# Patient Record
Sex: Female | Born: 1975 | Race: White | Hispanic: No | Marital: Married | Smoking: Former smoker
Health system: Southern US, Community
[De-identification: ages and names within clinical notes are randomized; demographics above are authoritative.]

## PROBLEM LIST (undated history)

## (undated) DIAGNOSIS — T7840XA Allergy, unspecified, initial encounter: Secondary | ICD-10-CM

## (undated) DIAGNOSIS — F419 Anxiety disorder, unspecified: Secondary | ICD-10-CM

## (undated) DIAGNOSIS — F32A Depression, unspecified: Secondary | ICD-10-CM

## (undated) DIAGNOSIS — J45909 Unspecified asthma, uncomplicated: Secondary | ICD-10-CM

## (undated) DIAGNOSIS — D649 Anemia, unspecified: Secondary | ICD-10-CM

## (undated) DIAGNOSIS — F329 Major depressive disorder, single episode, unspecified: Secondary | ICD-10-CM

## (undated) DIAGNOSIS — R12 Heartburn: Secondary | ICD-10-CM

## (undated) DIAGNOSIS — R51 Headache: Secondary | ICD-10-CM

## (undated) DIAGNOSIS — N979 Female infertility, unspecified: Secondary | ICD-10-CM

## (undated) DIAGNOSIS — R519 Headache, unspecified: Secondary | ICD-10-CM

## (undated) DIAGNOSIS — M549 Dorsalgia, unspecified: Secondary | ICD-10-CM

## (undated) DIAGNOSIS — J40 Bronchitis, not specified as acute or chronic: Secondary | ICD-10-CM

## (undated) DIAGNOSIS — E039 Hypothyroidism, unspecified: Secondary | ICD-10-CM

## (undated) DIAGNOSIS — R0602 Shortness of breath: Secondary | ICD-10-CM

## (undated) DIAGNOSIS — K5900 Constipation, unspecified: Secondary | ICD-10-CM

## (undated) HISTORY — DX: Shortness of breath: R06.02

## (undated) HISTORY — DX: Allergy, unspecified, initial encounter: T78.40XA

## (undated) HISTORY — PX: DILATION AND CURETTAGE OF UTERUS: SHX78

## (undated) HISTORY — DX: Dorsalgia, unspecified: M54.9

## (undated) HISTORY — DX: Female infertility, unspecified: N97.9

## (undated) HISTORY — DX: Heartburn: R12

## (undated) HISTORY — DX: Anxiety disorder, unspecified: F41.9

## (undated) HISTORY — DX: Constipation, unspecified: K59.00

## (undated) HISTORY — DX: Unspecified asthma, uncomplicated: J45.909

---
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
## 2004-11-05 ENCOUNTER — Ambulatory Visit (HOSPITAL_COMMUNITY): Admission: RE | Admit: 2004-11-05 | Discharge: 2004-11-05 | Payer: Self-pay | Admitting: Obstetrics and Gynecology

## 2004-11-05 ENCOUNTER — Ambulatory Visit (HOSPITAL_BASED_OUTPATIENT_CLINIC_OR_DEPARTMENT_OTHER): Admission: RE | Admit: 2004-11-05 | Discharge: 2004-11-05 | Payer: Self-pay | Admitting: Obstetrics and Gynecology

## 2005-10-30 ENCOUNTER — Inpatient Hospital Stay (HOSPITAL_COMMUNITY): Admission: AD | Admit: 2005-10-30 | Discharge: 2005-10-30 | Payer: Self-pay | Admitting: Obstetrics and Gynecology

## 2005-11-05 ENCOUNTER — Ambulatory Visit (HOSPITAL_COMMUNITY): Admission: RE | Admit: 2005-11-05 | Discharge: 2005-11-05 | Payer: Self-pay | Admitting: Obstetrics and Gynecology

## 2005-11-05 ENCOUNTER — Encounter (INDEPENDENT_AMBULATORY_CARE_PROVIDER_SITE_OTHER): Payer: Self-pay | Admitting: Specialist

## 2006-07-16 ENCOUNTER — Inpatient Hospital Stay (HOSPITAL_COMMUNITY): Admission: AD | Admit: 2006-07-16 | Discharge: 2006-07-16 | Payer: Self-pay | Admitting: Obstetrics and Gynecology

## 2007-02-05 ENCOUNTER — Inpatient Hospital Stay (HOSPITAL_COMMUNITY): Admission: AD | Admit: 2007-02-05 | Discharge: 2007-02-05 | Payer: Self-pay | Admitting: Obstetrics and Gynecology

## 2007-08-30 ENCOUNTER — Other Ambulatory Visit: Admission: RE | Admit: 2007-08-30 | Discharge: 2007-08-30 | Payer: Self-pay | Admitting: Family Medicine

## 2008-03-20 ENCOUNTER — Encounter: Admission: RE | Admit: 2008-03-20 | Discharge: 2008-03-20 | Payer: Self-pay | Admitting: Family Medicine

## 2009-09-03 ENCOUNTER — Other Ambulatory Visit: Admission: RE | Admit: 2009-09-03 | Discharge: 2009-09-03 | Payer: Self-pay | Admitting: Family Medicine

## 2010-07-03 NOTE — Op Note (Signed)
NAMEDEVANI, ODONNEL            ACCOUNT NO.:  [id number]   MEDICAL RECORD NO.:  [id number]          PATIENT TYPE:  AMB   LOCATION:  [facility]                         FACILITY:  [facility]   PHYSICIAN:  Michelle L. Grewal, M.D.DATE OF BIRTH:  1975/11/25   DATE OF PROCEDURE:  11/05/2004  DATE OF DISCHARGE:                                 OPERATIVE REPORT   PREOPERATIVE DIAGNOSES:  Missed abortion.   POSTOPERATIVE DIAGNOSES:  Missed abortion.   PROCEDURE:  Dilatation and evacuation with chromosome studies.   SURGEON:  Dr. Vincente Poli   ANESTHESIA:  MAC with paracervical block.   SPECIMENS:  Products of conception.   ESTIMATED BLOOD LOSS:  Minimal.   COMPLICATIONS:  None.   PROCEDURE:  Patient was taken to the operating room.  She was given her  sedation and placed in the lithotomy position.  She had voided just before  she had entered the operating room.  She is prepped and draped in the usual  sterile fashion.  A speculum is placed in the vagina.  The cervix is grasped  with a tenaculum and a paracervical block is performed in a standard  fashion.  The uterus is gently sounded and is 8 cm and is in the midline  position.  The cervical internal os is gently dilated to a #25 Pratt  dilator.  A #7 suction cannula is inserted into the uterus and suction  curettage is performed with retrieval of contents grossly consistent with  products of conception.  Sharp curette is then inserted and the uterus is  thoroughly curetted of all tissue.  A final suction curettage is performed  which is clean.  Half of the tissue is sent for pathology, half is sent for  chromosome analysis.  There is no active bleeding at the end of the  procedure.  All instruments removed from the vagina.  All sponge, lap, and  instrument counts correct x2.      Michelle L. Vincente Poli, M.D.  Electronically Signed     MLG/MEDQ  D:  11/05/2004  T:  11/05/2004  Job:  [id number]

## 2010-07-03 NOTE — Op Note (Signed)
Crystal Camacho, Crystal Camacho            ACCOUNT NO.:  [id number]   MEDICAL RECORD NO.:  [id number]          PATIENT TYPE:  AMB   LOCATION:  [facility]                           FACILITY:  [facility]   PHYSICIAN:  Michelle L. Grewal, M.D.DATE OF BIRTH:  August 08, 1975   DATE OF PROCEDURE:  11/05/2005  DATE OF DISCHARGE:  11/05/2005                                 OPERATIVE REPORT   PREOPERATIVE DIAGNOSIS:  Missed abortion and recurrent pregnancy loss.   POSTOPERATIVE DIAGNOSIS:  Missed abortion and recurrent pregnancy loss.   PROCEDURE:  Dilatation and evacuation.   SURGEON:  Michelle L. Vincente Poli, M.D.   ANESTHESIA:  MAC with local.   SPECIMENS:  Products of conception.   ESTIMATED BLOOD LOSS:  Minimal.   DESCRIPTION OF PROCEDURE:  The patient was taken to the operating room.  She  was given anesthesia.  She was placed in the lithotomy position.  She was  prepped and draped in the usual sterile fashion.  In-and-out catheter was  used to empty the bladder.  A speculum was placed in the vagina and the  cervix was grasped with tenaculum and a paracervical block was performed in  the standard fashion.  There was noted to be some blood in the vagina  already.  The cervical internal os was gently dilated using Pratt dilators  and a #7 suction cannula was inserted into the uterus easily and retrieved  contents consistent with products of conception.  After the suction  curettage was performed, a sharp curet was inserted in the remainder of the  tissue was curetted out.  A final suction curettage was performed. A portion  of the tissue was sent to pathology for analysis and another portion was  sent for routine karyotype given the patient's history of recurrent  pregnancy loss.  At the end of the procedure, there was minimal bleeding  noted.  All instruments and sponges were removed from the vagina.  The  patient went to the recovery room in stable condition.      Michelle L. Vincente Poli, M.D.  Electronically  Signed     MLG/MEDQ  D:  11/05/2005  T:  11/07/2005  Job:  [id number]

## 2010-09-09 ENCOUNTER — Other Ambulatory Visit: Payer: Self-pay | Admitting: Family Medicine

## 2010-09-09 ENCOUNTER — Other Ambulatory Visit (HOSPITAL_COMMUNITY)
Admission: RE | Admit: 2010-09-09 | Discharge: 2010-09-09 | Disposition: A | Discharge status: Home / Self Care | Payer: BC Managed Care – PPO | Source: Ambulatory Visit | Attending: Family Medicine | Admitting: Family Medicine

## 2010-09-09 DIAGNOSIS — Z124 Encounter for screening for malignant neoplasm of cervix: Secondary | ICD-10-CM | POA: Insufficient documentation

## 2011-11-16 ENCOUNTER — Other Ambulatory Visit (HOSPITAL_COMMUNITY)
Admission: RE | Admit: 2011-11-16 | Discharge: 2011-11-16 | Disposition: A | Discharge status: Home / Self Care | Payer: BC Managed Care – PPO | Source: Ambulatory Visit | Attending: Family Medicine | Admitting: Family Medicine

## 2011-11-16 ENCOUNTER — Other Ambulatory Visit: Payer: Self-pay | Admitting: Family Medicine

## 2011-11-16 DIAGNOSIS — Z124 Encounter for screening for malignant neoplasm of cervix: Secondary | ICD-10-CM | POA: Insufficient documentation

## 2014-03-12 ENCOUNTER — Other Ambulatory Visit: Payer: Self-pay | Admitting: Family Medicine

## 2014-03-12 ENCOUNTER — Other Ambulatory Visit (HOSPITAL_COMMUNITY)
Admission: RE | Admit: 2014-03-12 | Discharge: 2014-03-12 | Disposition: A | Discharge status: Home / Self Care | Payer: BC Managed Care – PPO | Source: Ambulatory Visit | Attending: Family Medicine | Admitting: Family Medicine

## 2014-03-12 DIAGNOSIS — Z124 Encounter for screening for malignant neoplasm of cervix: Secondary | ICD-10-CM | POA: Insufficient documentation

## 2014-03-12 DIAGNOSIS — Z1151 Encounter for screening for human papillomavirus (HPV): Secondary | ICD-10-CM | POA: Diagnosis present

## 2014-03-15 LAB — CYTOLOGY - PAP

## 2015-11-04 ENCOUNTER — Ambulatory Visit
Admission: RE | Admit: 2015-11-04 | Discharge: 2015-11-04 | Disposition: A | Discharge status: Home / Self Care | Payer: BC Managed Care – PPO | Source: Ambulatory Visit | Attending: Family Medicine | Admitting: Family Medicine

## 2015-11-04 ENCOUNTER — Other Ambulatory Visit: Payer: Self-pay | Admitting: Family Medicine

## 2015-11-04 DIAGNOSIS — R609 Edema, unspecified: Secondary | ICD-10-CM

## 2015-11-04 DIAGNOSIS — M79671 Pain in right foot: Secondary | ICD-10-CM

## 2015-11-04 DIAGNOSIS — T148XXA Other injury of unspecified body region, initial encounter: Secondary | ICD-10-CM

## 2016-01-23 ENCOUNTER — Ambulatory Visit
Admission: RE | Admit: 2016-01-23 | Discharge: 2016-01-23 | Disposition: A | Discharge status: Home / Self Care | Payer: BC Managed Care – PPO | Source: Ambulatory Visit | Attending: Family Medicine | Admitting: Family Medicine

## 2016-01-23 ENCOUNTER — Other Ambulatory Visit: Payer: Self-pay | Admitting: Family Medicine

## 2016-01-23 DIAGNOSIS — S6990XA Unspecified injury of unspecified wrist, hand and finger(s), initial encounter: Secondary | ICD-10-CM

## 2016-03-12 ENCOUNTER — Other Ambulatory Visit: Payer: Self-pay | Admitting: Family Medicine

## 2016-03-12 ENCOUNTER — Ambulatory Visit
Admission: RE | Admit: 2016-03-12 | Discharge: 2016-03-12 | Disposition: A | Discharge status: Home / Self Care | Payer: BC Managed Care – PPO | Source: Ambulatory Visit | Attending: Family Medicine | Admitting: Family Medicine

## 2016-03-12 DIAGNOSIS — M940 Chondrocostal junction syndrome [Tietze]: Secondary | ICD-10-CM

## 2016-09-29 ENCOUNTER — Other Ambulatory Visit: Payer: Self-pay | Admitting: Family Medicine

## 2016-09-29 DIAGNOSIS — N644 Mastodynia: Secondary | ICD-10-CM

## 2016-10-01 ENCOUNTER — Ambulatory Visit
Admission: RE | Admit: 2016-10-01 | Discharge: 2016-10-01 | Disposition: A | Discharge status: Home / Self Care | Payer: BC Managed Care – PPO | Source: Ambulatory Visit | Attending: Family Medicine | Admitting: Family Medicine

## 2016-10-01 DIAGNOSIS — N644 Mastodynia: Secondary | ICD-10-CM

## 2017-02-07 NOTE — H&P (Signed)
41 year old G 4 P 4 with menorrhagia and desires permanent sterilization.  No past medical history on file.   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

## 2017-02-23 NOTE — Patient Instructions (Addendum)
Your procedure is scheduled on: Wednesday March 02, 2017 at 1:00 pm  [facility] through [facility] at: 11:30 am   Pick up the phone at the desk and dial [phone].  Call this number if you have problems the morning of surgery: [phone].  Remember: Do NOT eat food: after Midnight on Tuesday January 15 Do NOT drink clear liquids after: 7:00 am day of surgery Take these medicines the morning of surgery with a SIP OF WATER: Synthroid, Prozac, bring Albuterol Inhaler with you day of surgery  STOP ALL VITAMINS AND SUPPLEMENTS 1 WEEK PRIOR TO SURGERY  DO NOT SMOKE DAY OF SURGERY  Do NOT wear jewelry (body piercing), metal hair clips/bobby pins, make-up, or nail polish. Do NOT wear lotions, powders, or perfumes.  You may wear deoderant. Do NOT shave for 48 hours prior to surgery. Do NOT bring valuables to the hospital. Contacts, dentures, or bridgework may not be worn into surgery.  Have a responsible adult drive you home and stay with you for 24 hours after your procedure

## 2017-02-24 ENCOUNTER — Encounter (HOSPITAL_COMMUNITY): Payer: Self-pay

## 2017-02-24 ENCOUNTER — Encounter (HOSPITAL_COMMUNITY)
Admission: RE | Admit: 2017-02-24 | Discharge: 2017-02-24 | Disposition: A | Discharge status: Home / Self Care | Payer: BC Managed Care – PPO | Source: Ambulatory Visit | Attending: Obstetrics and Gynecology | Admitting: Obstetrics and Gynecology

## 2017-02-24 ENCOUNTER — Other Ambulatory Visit: Payer: Self-pay

## 2017-02-24 DIAGNOSIS — Z01812 Encounter for preprocedural laboratory examination: Secondary | ICD-10-CM | POA: Insufficient documentation

## 2017-02-24 HISTORY — DX: Anemia, unspecified: D64.9

## 2017-02-24 HISTORY — DX: Depression, unspecified: F32.A

## 2017-02-24 HISTORY — DX: Major depressive disorder, single episode, unspecified: F32.9

## 2017-02-24 HISTORY — DX: Headache: R51

## 2017-02-24 HISTORY — DX: Bronchitis, not specified as acute or chronic: J40

## 2017-02-24 HISTORY — DX: Hypothyroidism, unspecified: E03.9

## 2017-02-24 HISTORY — DX: Headache, unspecified: R51.9

## 2017-02-24 LAB — TYPE AND SCREEN
ABO/RH(D): O POS
Antibody Screen: NEGATIVE

## 2017-02-24 LAB — CBC
HEMATOCRIT: 37 % (ref 36.0–46.0)
Hemoglobin: 12.4 g/dL (ref 12.0–15.0)
MCH: 28.9 pg (ref 26.0–34.0)
MCHC: 33.5 g/dL (ref 30.0–36.0)
MCV: 86.2 fL (ref 78.0–100.0)
PLATELETS: 224 10*3/uL (ref 150–400)
RBC: 4.29 MIL/uL (ref 3.87–5.11)
RDW: 14.6 % (ref 11.5–15.5)
WBC: 4 10*3/uL (ref 4.0–10.5)

## 2017-02-24 LAB — ABO/RH: ABO/RH(D): O POS

## 2017-03-02 ENCOUNTER — Encounter (HOSPITAL_COMMUNITY): Payer: Self-pay | Admitting: Certified Registered Nurse Anesthetist

## 2017-03-02 ENCOUNTER — Ambulatory Visit (HOSPITAL_COMMUNITY): Payer: BC Managed Care – PPO | Admitting: Anesthesiology

## 2017-03-02 ENCOUNTER — Ambulatory Visit (HOSPITAL_COMMUNITY)
Admission: AD | Admit: 2017-03-02 | Discharge: 2017-03-02 | Disposition: A | Discharge status: Home / Self Care | Payer: BC Managed Care – PPO | Source: Ambulatory Visit | Attending: Obstetrics and Gynecology | Admitting: Obstetrics and Gynecology

## 2017-03-02 ENCOUNTER — Encounter (HOSPITAL_COMMUNITY)
Admission: AD | Disposition: A | Discharge status: Home / Self Care | Payer: Self-pay | Source: Ambulatory Visit | Attending: Obstetrics and Gynecology

## 2017-03-02 DIAGNOSIS — Z87891 Personal history of nicotine dependence: Secondary | ICD-10-CM | POA: Diagnosis not present

## 2017-03-02 DIAGNOSIS — Z79899 Other long term (current) drug therapy: Secondary | ICD-10-CM | POA: Diagnosis not present

## 2017-03-02 DIAGNOSIS — F329 Major depressive disorder, single episode, unspecified: Secondary | ICD-10-CM | POA: Insufficient documentation

## 2017-03-02 DIAGNOSIS — Z79890 Hormone replacement therapy (postmenopausal): Secondary | ICD-10-CM | POA: Diagnosis not present

## 2017-03-02 DIAGNOSIS — Z302 Encounter for sterilization: Secondary | ICD-10-CM | POA: Insufficient documentation

## 2017-03-02 DIAGNOSIS — Z9889 Other specified postprocedural states: Secondary | ICD-10-CM | POA: Diagnosis not present

## 2017-03-02 DIAGNOSIS — N920 Excessive and frequent menstruation with regular cycle: Secondary | ICD-10-CM | POA: Insufficient documentation

## 2017-03-02 DIAGNOSIS — Z6835 Body mass index (BMI) 35.0-35.9, adult: Secondary | ICD-10-CM | POA: Diagnosis not present

## 2017-03-02 DIAGNOSIS — N840 Polyp of corpus uteri: Secondary | ICD-10-CM | POA: Insufficient documentation

## 2017-03-02 HISTORY — PX: DILITATION & CURRETTAGE/HYSTROSCOPY WITH HYDROTHERMAL ABLATION: SHX5570

## 2017-03-02 HISTORY — PX: POLYPECTOMY: SHX5525

## 2017-03-02 HISTORY — PX: LAPAROSCOPIC TUBAL LIGATION: SHX1937

## 2017-03-02 LAB — PREGNANCY, URINE: PREG TEST UR: NEGATIVE

## 2017-03-02 SURGERY — LIGATION, FALLOPIAN TUBE, LAPAROSCOPIC
Anesthesia: General

## 2017-03-02 MED ORDER — SCOPOLAMINE 1 MG/3DAYS TD PT72
MEDICATED_PATCH | TRANSDERMAL | Status: AC
  Administered 2017-03-02: 1.5 mg via TRANSDERMAL
  Filled 2017-03-02: qty 1

## 2017-03-02 MED ORDER — KETOROLAC TROMETHAMINE 30 MG/ML IJ SOLN
INTRAMUSCULAR | Status: DC | PRN
  Administered 2017-03-02: 30 mg via INTRAVENOUS

## 2017-03-02 MED ORDER — PROPOFOL 10 MG/ML IV BOLUS
INTRAVENOUS | Status: DC | PRN
  Administered 2017-03-02: 200 mg via INTRAVENOUS

## 2017-03-02 MED ORDER — SODIUM CHLORIDE 0.9 % IJ SOLN
INTRAMUSCULAR | Status: AC
  Filled 2017-03-02: qty 10

## 2017-03-02 MED ORDER — HYDROMORPHONE HCL 1 MG/ML IJ SOLN
INTRAMUSCULAR | Status: AC
  Filled 2017-03-02: qty 1

## 2017-03-02 MED ORDER — BUPIVACAINE HCL (PF) 0.25 % IJ SOLN
INTRAMUSCULAR | Status: AC
  Filled 2017-03-02: qty 30

## 2017-03-02 MED ORDER — KETOROLAC TROMETHAMINE 30 MG/ML IJ SOLN
INTRAMUSCULAR | Status: AC
  Filled 2017-03-02: qty 1

## 2017-03-02 MED ORDER — ONDANSETRON HCL 4 MG/2ML IJ SOLN
INTRAMUSCULAR | Status: DC | PRN
  Administered 2017-03-02: 4 mg via INTRAVENOUS

## 2017-03-02 MED ORDER — LIDOCAINE HCL 1 % IJ SOLN
INTRAMUSCULAR | Status: AC
  Filled 2017-03-02: qty 20

## 2017-03-02 MED ORDER — LACTATED RINGERS IV SOLN
INTRAVENOUS | Status: DC
  Administered 2017-03-02 (×2): via INTRAVENOUS

## 2017-03-02 MED ORDER — LIDOCAINE HCL (PF) 1 % IJ SOLN
INTRAMUSCULAR | Status: AC
  Filled 2017-03-02: qty 5

## 2017-03-02 MED ORDER — CEFOTETAN DISODIUM-DEXTROSE 2-2.08 GM-%(50ML) IV SOLR
INTRAVENOUS | Status: AC
  Filled 2017-03-02: qty 50

## 2017-03-02 MED ORDER — ONDANSETRON HCL 4 MG/2ML IJ SOLN
INTRAMUSCULAR | Status: AC
  Filled 2017-03-02: qty 2

## 2017-03-02 MED ORDER — SUGAMMADEX SODIUM 200 MG/2ML IV SOLN
INTRAVENOUS | Status: AC
  Filled 2017-03-02: qty 2

## 2017-03-02 MED ORDER — SUGAMMADEX SODIUM 200 MG/2ML IV SOLN
INTRAVENOUS | Status: DC | PRN
  Administered 2017-03-02: 200 mg via INTRAVENOUS

## 2017-03-02 MED ORDER — DEXAMETHASONE SODIUM PHOSPHATE 4 MG/ML IJ SOLN
INTRAMUSCULAR | Status: AC
  Filled 2017-03-02: qty 1

## 2017-03-02 MED ORDER — LACTATED RINGERS IV SOLN: INTRAVENOUS | Status: DC

## 2017-03-02 MED ORDER — PROPOFOL 10 MG/ML IV BOLUS
INTRAVENOUS | Status: AC
  Filled 2017-03-02: qty 20

## 2017-03-02 MED ORDER — LIDOCAINE HCL (CARDIAC) 20 MG/ML IV SOLN
INTRAVENOUS | Status: DC | PRN
  Administered 2017-03-02: 50 mg via INTRAVENOUS

## 2017-03-02 MED ORDER — SCOPOLAMINE 1 MG/3DAYS TD PT72
1.0000 | MEDICATED_PATCH | Freq: Once | TRANSDERMAL | Status: DC
  Administered 2017-03-02: 1.5 mg via TRANSDERMAL

## 2017-03-02 MED ORDER — HYDROMORPHONE HCL 1 MG/ML IJ SOLN
0.2500 mg | INTRAMUSCULAR | Status: DC | PRN
  Administered 2017-03-02 (×2): 0.5 mg via INTRAVENOUS

## 2017-03-02 MED ORDER — MIDAZOLAM HCL 2 MG/2ML IJ SOLN
INTRAMUSCULAR | Status: DC | PRN
  Administered 2017-03-02: 2 mg via INTRAVENOUS

## 2017-03-02 MED ORDER — SODIUM CHLORIDE 0.9 % IR SOLN
Status: DC | PRN
  Administered 2017-03-02: 3000 mL

## 2017-03-02 MED ORDER — FENTANYL CITRATE (PF) 100 MCG/2ML IJ SOLN
INTRAMUSCULAR | Status: DC | PRN
  Administered 2017-03-02: 25 ug via INTRAVENOUS
  Administered 2017-03-02 (×2): 50 ug via INTRAVENOUS

## 2017-03-02 MED ORDER — MIDAZOLAM HCL 2 MG/2ML IJ SOLN
INTRAMUSCULAR | Status: AC
  Filled 2017-03-02: qty 2

## 2017-03-02 MED ORDER — ROCURONIUM BROMIDE 100 MG/10ML IV SOLN
INTRAVENOUS | Status: DC | PRN
  Administered 2017-03-02: 40 mg via INTRAVENOUS

## 2017-03-02 MED ORDER — CEFOTETAN DISODIUM-DEXTROSE 2-2.08 GM-%(50ML) IV SOLR
2.0000 g | INTRAVENOUS | Status: AC
  Administered 2017-03-02: 2 g via INTRAVENOUS

## 2017-03-02 MED ORDER — SODIUM CHLORIDE 0.9 % IJ SOLN
INTRAMUSCULAR | Status: DC | PRN
  Administered 2017-03-02: 10 mL

## 2017-03-02 MED ORDER — ROCURONIUM BROMIDE 100 MG/10ML IV SOLN
INTRAVENOUS | Status: AC
  Filled 2017-03-02: qty 1

## 2017-03-02 MED ORDER — FENTANYL CITRATE (PF) 250 MCG/5ML IJ SOLN
INTRAMUSCULAR | Status: AC
  Filled 2017-03-02: qty 5

## 2017-03-02 MED ORDER — DEXAMETHASONE SODIUM PHOSPHATE 10 MG/ML IJ SOLN
INTRAMUSCULAR | Status: DC | PRN
  Administered 2017-03-02: 4 mg via INTRAVENOUS

## 2017-03-02 MED ORDER — BUPIVACAINE HCL (PF) 0.25 % IJ SOLN
INTRAMUSCULAR | Status: DC | PRN
  Administered 2017-03-02: 7 mL

## 2017-03-02 MED ORDER — PROMETHAZINE HCL 25 MG/ML IJ SOLN: 6.2500 mg | INTRAMUSCULAR | Status: DC | PRN

## 2017-03-02 SURGICAL SUPPLY — 32 items
ADH SKN CLS APL DERMABOND .7 (GAUZE/BANDAGES/DRESSINGS) ×3
CANISTER SUCT 3000ML PPV (MISCELLANEOUS) ×5 IMPLANT
CATH ROBINSON RED A/P 16FR (CATHETERS) ×5 IMPLANT
DERMABOND ADVANCED (GAUZE/BANDAGES/DRESSINGS) ×2
DERMABOND ADVANCED .7 DNX12 (GAUZE/BANDAGES/DRESSINGS) ×3 IMPLANT
DRSG OPSITE POSTOP 3X4 (GAUZE/BANDAGES/DRESSINGS) ×2 IMPLANT
DURAPREP 26ML APPLICATOR (WOUND CARE) ×5 IMPLANT
ELECT REM PT RETURN 9FT ADLT (ELECTROSURGICAL) ×5
ELECTRODE REM PT RTRN 9FT ADLT (ELECTROSURGICAL) ×3 IMPLANT
GLOVE BIO SURGEON STRL SZ 6.5 (GLOVE) ×4 IMPLANT
GLOVE BIO SURGEONS STRL SZ 6.5 (GLOVE) ×1
GLOVE BIOGEL PI IND STRL 7.0 (GLOVE) ×3 IMPLANT
GLOVE BIOGEL PI INDICATOR 7.0 (GLOVE) ×2
GOWN STRL REUS W/TWL LRG LVL3 (GOWN DISPOSABLE) ×10 IMPLANT
NEEDLE INSUFFLATION 120MM (ENDOMECHANICALS) ×5 IMPLANT
PACK LAPAROSCOPY BASIN (CUSTOM PROCEDURE TRAY) ×5 IMPLANT
PACK TRENDGUARD 450 HYBRID PRO (MISCELLANEOUS) IMPLANT
PACK TRENDGUARD 600 HYBRD PROC (MISCELLANEOUS) IMPLANT
PACK VAGINAL MINOR WOMEN LF (CUSTOM PROCEDURE TRAY) ×5 IMPLANT
PAD OB MATERNITY 4.3X12.25 (PERSONAL CARE ITEMS) ×5 IMPLANT
PROTECTOR NERVE ULNAR (MISCELLANEOUS) ×10 IMPLANT
SET GENESYS HTA PROCERVA (MISCELLANEOUS) ×5 IMPLANT
SLEEVE XCEL OPT CAN 5 100 (ENDOMECHANICALS) IMPLANT
SUT VIC AB 3-0 PS2 18 (SUTURE)
SUT VIC AB 3-0 PS2 18XBRD (SUTURE) IMPLANT
SUT VICRYL 0 UR6 27IN ABS (SUTURE) ×7 IMPLANT
TOWEL OR 17X24 6PK STRL BLUE (TOWEL DISPOSABLE) ×10 IMPLANT
TRENDGUARD 450 HYBRID PRO PACK (MISCELLANEOUS) ×5
TRENDGUARD 600 HYBRID PROC PK (MISCELLANEOUS)
TROCAR BALLN 12MMX100 BLUNT (TROCAR) ×2 IMPLANT
TROCAR XCEL DIL TIP R 11M (ENDOMECHANICALS) ×5 IMPLANT
WARMER LAPAROSCOPE (MISCELLANEOUS) ×5 IMPLANT

## 2017-03-02 NOTE — Brief Op Note (Signed)
03/02/2017  2:17 PM  PATIENT:  Crystal Camacho  42 y.o. female  PRE-OPERATIVE DIAGNOSIS:  Menorrhagia Desires permanent sterilization  POST-OPERATIVE DIAGNOSIS:  Same Unsuccessful tubal ligation Endometrial polyps  PROCEDURE:  Procedure(s): ATTEMPTED LAPAROSCOPY, UNSUCESSFUL TUBAL LIGATION (Bilateral) DILATATION & CURETTAGE/HYSTEROSCOPY WITH HYDROTHERMAL ABLATION (N/A) POLYPECTOMY  SURGEON:  Surgeon(s) and Role:    Marcelle Overlie* Ercell Razon, MD - Primary  PHYSICIAN ASSISTANT:   ASSISTANTS: none   ANESTHESIA:   general  EBL:  25 mL   BLOOD ADMINISTERED:none  DRAINS: none   LOCAL MEDICATIONS USED:  LIDOCAINE   SPECIMEN:  Source of Specimen:  uterine curettings  DISPOSITION OF SPECIMEN:  PATHOLOGY  COUNTS:  YES  TOURNIQUET:  * No tourniquets in log *  DICTATION: .Other Dictation: Dictation Number dictated  PLAN OF CARE: Discharge to home after PACU  PATIENT DISPOSITION:  PACU - hemodynamically stable.   Delay start of Pharmacological VTE agent (>24hrs) due to surgical blood loss or risk of bleeding: not applicable

## 2017-03-02 NOTE — Anesthesia Preprocedure Evaluation (Addendum)
Anesthesia Evaluation  Patient identified by MRN, date of birth, ID band Patient awake    Reviewed: Allergy & Precautions, NPO status , Patient's Chart, lab work & pertinent test results  History of Anesthesia Complications Negative for: history of anesthetic complications  Airway Mallampati: II  TM Distance: >3 FB Neck ROM: Full    Dental no notable dental hx. (+) Dental Advisory Given   Pulmonary neg pulmonary ROS, former smoker,    Pulmonary exam normal        Cardiovascular negative cardio ROS Normal cardiovascular exam     Neuro/Psych PSYCHIATRIC DISORDERS Depression negative neurological ROS     GI/Hepatic negative GI ROS, Neg liver ROS,   Endo/Other  Hypothyroidism Morbid obesity  Renal/GU negative Renal ROS     Musculoskeletal negative musculoskeletal ROS (+)   Abdominal   Peds  Hematology negative hematology ROS (+)   Anesthesia Other Findings Day of surgery medications reviewed with the patient.  Reproductive/Obstetrics                            Anesthesia Physical Anesthesia Plan  ASA: II  Anesthesia Plan: General   Post-op Pain Management:    Induction: Intravenous  PONV Risk Score and Plan: 4 or greater and Ondansetron, Dexamethasone, Scopolamine patch - Pre-op and Diphenhydramine  Airway Management Planned: Oral ETT  Additional Equipment:   Intra-op Plan:   Post-operative Plan: Extubation in OR  Informed Consent: I have reviewed the patients History and Physical, chart, labs and discussed the procedure including the risks, benefits and alternatives for the proposed anesthesia with the patient or authorized representative who has indicated his/her understanding and acceptance.   Dental advisory given  Plan Discussed with: Anesthesiologist and CRNA  Anesthesia Plan Comments:        Anesthesia Quick Evaluation

## 2017-03-02 NOTE — Transfer of Care (Signed)
Immediate Anesthesia Transfer of Care Note  Patient: Crystal Camacho  Procedure(s) Performed: ATTEMPTED LAPAROSCOPY, UNSUCESSFUL TUBAL LIGATION (Bilateral ) DILATATION & CURETTAGE/HYSTEROSCOPY WITH HYDROTHERMAL ABLATION (N/A ) POLYPECTOMY  Patient Location: PACU  Anesthesia Type:General  Level of Consciousness: awake, alert  and oriented  Airway & Oxygen Therapy: Patient Spontanous Breathing and Patient connected to nasal cannula oxygen  Post-op Assessment: Report given to RN and Post -op Vital signs reviewed and stable  Post vital signs: Reviewed and stable  Last Vitals:  Vitals:   03/02/17 1128  BP: 115/80  Pulse: 71  Resp: 16  Temp: 36.7 C  SpO2: 100%    Last Pain:  Vitals:   03/02/17 1128  TempSrc: Oral      Patients Stated Pain Goal: 3 (03/02/17 1128)  Complications: No apparent anesthesia complications

## 2017-03-02 NOTE — Anesthesia Procedure Notes (Signed)
Procedure Name: Intubation Date/Time: 03/02/2017 1:05 PM Performed by: Cleda ClarksBrowder, Nasha Diss R, CRNA Pre-anesthesia Checklist: Patient identified, Emergency Drugs available, Suction available and Patient being monitored Patient Re-evaluated:Patient Re-evaluated prior to induction Oxygen Delivery Method: Circle system utilized Preoxygenation: Pre-oxygenation with 100% oxygen Induction Type: IV induction Ventilation: Mask ventilation without difficulty Laryngoscope Size: Miller and 2 Grade View: Grade I Tube type: Oral Tube size: 7.0 mm Number of attempts: 1 Airway Equipment and Method: Stylet and Oral airway Placement Confirmation: ETT inserted through vocal cords under direct vision,  positive ETCO2 and breath sounds checked- equal and bilateral Secured at: 20 cm Tube secured with: Tape Dental Injury: Teeth and Oropharynx as per pre-operative assessment

## 2017-03-02 NOTE — Discharge Instructions (Signed)
DISCHARGE INSTRUCTIONS: Laparoscopy  The following instructions have been prepared to help you care for yourself upon your return home today.  Wound care:  Do not get the incision wet for the first 24 hours. The incision should be kept clean and dry.  The Band-Aids or dressings may be removed the day after surgery.  Should the incision become sore, red, and swollen after the first week, check with your doctor.  Personal hygiene:  Shower the day after your procedure.  Activity and limitations:  Do NOT drive or operate any equipment today.  Do NOT lift anything more than 15 pounds for 2-3 weeks after surgery.  Do NOT rest in bed all day.  Walking is encouraged. Walk each day, starting slowly with 5-minute walks 3 or 4 times a day. Slowly increase the length of your walks.  Walk up and down stairs slowly.  Do NOT do strenuous activities, such as golfing, playing tennis, bowling, running, biking, weight lifting, gardening, mowing, or vacuuming for 2-4 weeks. Ask your doctor when it is okay to start.  Diet: Eat a light meal as desired this evening. You may resume your usual diet tomorrow.  Return to work: This is dependent on the type of work you do. For the most part you can return to a desk job within a week of surgery. If you are more active at work, please discuss this with your doctor.  What to expect after your surgery: You may have a slight burning sensation when you urinate on the first day. You may have a very small amount of blood in the urine. Expect to have a small amount of vaginal discharge/light bleeding for 1-2 weeks. It is not unusual to have abdominal soreness and bruising for up to 2 weeks. You may be tired and need more rest for about 1 week. You may experience shoulder pain for 24-72 hours. Lying flat in bed may relieve it.  Call your doctor for any of the following:  Develop a fever of 100.4 or greater  Inability to urinate 6 hours after discharge from  hospital  Severe pain not relieved by pain medications  Persistent of heavy bleeding at incision site  Redness or swelling around incision site after a week  Increasing nausea or vomiting  Patient Signature________________________________________ Nurse Signature_________________________________________   DISCHARGE INSTRUCTIONS: HYSTEROSCOPY / ENDOMETRIAL ABLATION The following instructions have been prepared to help you care for yourself upon your return home.  May Remove Scop patch on or before  May take Ibuprofen after  May take stool softner while taking narcotic pain medication to prevent constipation.  Drink plenty of water.  Personal hygiene:  Use sanitary pads for vaginal drainage, not tampons.  Shower the day after your procedure.  NO tub baths, pools or Jacuzzis for 2-3 weeks.  Wipe front to back after using the bathroom.  Activity and limitations:  Do NOT drive or operate any equipment for 24 hours. The effects of anesthesia are still present and drowsiness may result.  Do NOT rest in bed all day.  Walking is encouraged.  Walk up and down stairs slowly.  You may resume your normal activity in one to two days or as indicated by your physician. Sexual activity: NO intercourse for at least 2 weeks after the procedure, or as indicated by your Doctor.  Diet: Eat a light meal as desired this evening. You may resume your usual diet tomorrow.  Return to Work: You may resume your work activities in one to two days  or as indicated by your Doctor.  What to expect after your surgery: Expect to have vaginal bleeding/discharge for 2-3 days and spotting for up to 10 days. It is not unusual to have soreness for up to 1-2 weeks. You may have a slight burning sensation when you urinate for the first day. Mild cramps may continue for a couple of days. You may have a regular period in 2-6 weeks.  Call your doctor for any of the following:  Excessive vaginal bleeding  or clotting, saturating and changing one pad every hour.  Inability to urinate 6 hours after discharge from hospital.  Pain not relieved by pain medication.  Fever of 100.4 F or greater.  Unusual vaginal discharge or odor.  Return to office _________________Call for an appointment ___________________ Patients signature: ______________________ Nurses signature ________________________  Post Anesthesia Care Unit [phone]    Post Anesthesia Home Care Instructions  Activity: Get plenty of rest for the remainder of the day. A responsible individual must stay with you for 24 hours following the procedure.  For the next 24 hours, DO NOT: -Drive a car -Advertising copywriter -Drink alcoholic beverages -Take any medication unless instructed by your physician -Make any legal decisions or sign important papers.  Meals: Start with liquid foods such as gelatin or soup. Progress to regular foods as tolerated. Avoid greasy, spicy, heavy foods. If nausea and/or vomiting occur, drink only clear liquids until the nausea and/or vomiting subsides. Call your physician if vomiting continues.  Special Instructions/Symptoms: Your throat may feel dry or sore from the anesthesia or the breathing tube placed in your throat during surgery. If this causes discomfort, gargle with warm salt water. The discomfort should disappear within 24 hours.  If you had a scopolamine patch placed behind your ear for the management of post- operative nausea and/or vomiting:  1. The medication in the patch is effective for 72 hours, after which it should be removed.  Wrap patch in a tissue and discard in the trash. Wash hands thoroughly with soap and water. 2. You may remove the patch earlier than 72 hours if you experience unpleasant side effects which may include dry mouth, dizziness or visual disturbances. 3. Avoid touching the patch. Wash your hands with soap and water after contact with the patch.   NO IBUPROFEN  PRODUCTS (MOTRIN, ADVIL) OR ALEVE UNTIL 8:00PM TODAY.

## 2017-03-02 NOTE — Anesthesia Postprocedure Evaluation (Signed)
Anesthesia Post Note  Patient: Crystal Camacho  Procedure(s) Performed: ATTEMPTED LAPAROSCOPY, UNSUCESSFUL TUBAL LIGATION (Bilateral ) DILATATION & CURETTAGE/HYSTEROSCOPY WITH HYDROTHERMAL ABLATION (N/A ) POLYPECTOMY     Patient location during evaluation: PACU Anesthesia Type: General Level of consciousness: sedated Pain management: pain level controlled Vital Signs Assessment: post-procedure vital signs reviewed and stable Respiratory status: spontaneous breathing and respiratory function stable Cardiovascular status: stable Postop Assessment: no apparent nausea or vomiting Anesthetic complications: no    Last Vitals:  Vitals:   03/02/17 1445 03/02/17 1515  BP: 130/80 116/79  Pulse: 64 (!) 59  Resp: 17 12  Temp:    SpO2: 99% 98%    Last Pain:  Vitals:   03/02/17 1515  TempSrc:   PainSc: Asleep   Pain Goal: Patients Stated Pain Goal: 3 (03/02/17 1128)               Marilyn Nihiser DANIEL

## 2017-03-02 NOTE — Progress Notes (Signed)
H and P on the chart No changes Will proceed with LSC BTL, HSC, D and C, HTA Consent signed

## 2017-03-03 ENCOUNTER — Encounter (HOSPITAL_COMMUNITY): Payer: Self-pay | Admitting: Obstetrics and Gynecology

## 2017-03-04 NOTE — Op Note (Signed)
Crystal Camacho, Crystal Camacho            ACCOUNT NO.:  [id number]  MEDICAL RECORD NO.:  [id number]  LOCATION:  [facility]                          FACILITY:  [facility]  PHYSICIAN:  Lio Wehrly L. Ferman Basilio, M.D.DATE OF BIRTH:  05/18/1975  DATE OF PROCEDURE:  03/02/2017 DATE OF DISCHARGE:                              OPERATIVE REPORT   PREOPERATIVE DIAGNOSES:  Menorrhagia and desires permanent sterilization.  POSTOPERATIVE DIAGNOSES:  Menorrhagia and desires permanent sterilization, it is unable to perform tubal ligation, and endometrial polyps.  SURGERIES:  Attempted diagnostic laparoscopy and hysteroscopy, D and C, removal of endometrial tissue, and hydrothermal ablation.  SURGEON:  Roya Gieselman L. Vincente Poli, M.D.  ANESTHESIA:  General.  ESTIMATED BLOOD LOSS:  Minimal.  COMPLICATIONS:  None.  DESCRIPTION OF PROCEDURE:  The patient was taken to the operating room. She was then administered anesthesia in a standard fashion.  Exam under anesthesia reveals some central obesity.  She was prepped and draped and an in-and-out catheter was used to empty the bladder.  A uterine manipulator was inserted.  We then made a small incision at the umbilicus and at the umbilicus, the Veress needle was inserted.  We first started with a short Veress needle, but because of her significant central obesity, the Veress needle was not quite long enough to reach intraperitoneally, so I removed the Veress needle, and then decided to proceed with a possible open laparoscopy and using the Hasson trocar. However, when I used Allis clamps to try to grasp the fascia, it was very deep more than 5 cm down from the skin, and I did open the fascia, however, the peritoneum was even further below that.  I felt because of her central obesity that the instruments would be too short for [place] to proceed further safely with laparoscopy, so decided not to proceed with the tubal ligation.  We then closed the fascia and then closed the skin with  Dermabond and placed a dressing on that.  I then went down to the vagina, inserted a speculum into the vagina.  The cervix was grasped with a tenaculum.  The cervical internal os was gently dilated.  A sharp curette was inserted and a moderate amount of tissue which appeared to be polypoid was then retrieved with a curettage.  The hysteroscope was inserted and there was a large endometrial polyp that appeared to be partially detached and was partially in the endocervical canal, which then I removed the scope and then inserted polyp forceps to remove the remainder of the tissue.  I then inserted the hysteroscopic probe and performed a fluid check and the fluid check was 0 mL fluid loss and performed standard HTA with a 10-minute cycle without any complications. At the end of the procedure, all instruments were removed.  The patient tolerated the procedure well.  She was extubated and went to the recovery room in stable condition.  I did discuss the findings with the patient's spouse that I was unable to perform the tubal ligation.  When she comes back into the office for postop check, we will discuss this further.     Anniemae Haberkorn L. Vincente Poli, M.D.     Florestine Avers  D:  03/03/2017  T:  03/04/2017  Job:  [id number]

## 2017-07-15 DIAGNOSIS — E039 Hypothyroidism, unspecified: Secondary | ICD-10-CM | POA: Insufficient documentation

## 2017-07-15 DIAGNOSIS — F329 Major depressive disorder, single episode, unspecified: Secondary | ICD-10-CM | POA: Insufficient documentation

## 2017-07-15 DIAGNOSIS — F32A Depression, unspecified: Secondary | ICD-10-CM | POA: Insufficient documentation

## 2017-07-19 ENCOUNTER — Encounter: Payer: Self-pay | Admitting: Adult Health

## 2017-07-19 ENCOUNTER — Ambulatory Visit (INDEPENDENT_AMBULATORY_CARE_PROVIDER_SITE_OTHER): Payer: BC Managed Care – PPO | Admitting: Adult Health

## 2017-07-19 VITALS — BP 118/76 | HR 73 | Ht 67.25 in | Wt 236.2 lb

## 2017-07-19 DIAGNOSIS — Z6832 Body mass index (BMI) 32.0-32.9, adult: Secondary | ICD-10-CM | POA: Insufficient documentation

## 2017-07-19 DIAGNOSIS — Z0000 Encounter for general adult medical examination without abnormal findings: Secondary | ICD-10-CM | POA: Diagnosis not present

## 2017-07-19 DIAGNOSIS — E039 Hypothyroidism, unspecified: Secondary | ICD-10-CM

## 2017-07-19 DIAGNOSIS — Z6836 Body mass index (BMI) 36.0-36.9, adult: Secondary | ICD-10-CM | POA: Diagnosis not present

## 2017-07-19 DIAGNOSIS — Z6833 Body mass index (BMI) 33.0-33.9, adult: Secondary | ICD-10-CM | POA: Insufficient documentation

## 2017-07-19 MED ORDER — FLUOXETINE HCL 40 MG PO CAPS: 40.0000 mg | ORAL_CAPSULE | Freq: Every day | ORAL | 2 refills | Status: DC

## 2017-07-19 MED ORDER — PHENTERMINE HCL 37.5 MG PO TABS: 37.5000 mg | ORAL_TABLET | Freq: Every day | ORAL | 0 refills | Status: DC

## 2017-07-19 NOTE — Assessment & Plan Note (Signed)
Continue Fluoxetine 40mg  once daily. Please start Phentermine 37.5mg  once each am. Increase water intake, strive for at least 120 ounces/day.   Follow Heart Healthy diet Increase regular exercise.  Recommend at least 30 minutes daily, 5 days per week of walking, jogging, biking, swimming, YouTube/Pinterest workout videos. Please follow-up in 3-4 weeks.

## 2017-07-19 NOTE — Patient Instructions (Addendum)
Mediterranean Diet A Mediterranean diet refers to food and lifestyle choices that are based on the traditions of countries located on the [place]. This way of eating has been shown to help prevent certain conditions and improve outcomes for people who have chronic diseases, like kidney disease and heart disease. What are tips for following this plan? Lifestyle  Cook and eat meals together with your family, when possible.  Drink enough fluid to keep your urine clear or pale yellow.  Be physically active every day. This includes: ? Aerobic exercise like running or swimming. ? Leisure activities like gardening, walking, or housework.  Get 7-8 hours of sleep each night.  If recommended by your health care provider, drink red wine in moderation. This means 1 glass a day for nonpregnant women and 2 glasses a day for men. A glass of wine equals 5 oz (150 mL). Reading food labels  Check the serving size of packaged foods. For foods such as rice and pasta, the serving size refers to the amount of cooked product, not dry.  Check the total fat in packaged foods. Avoid foods that have saturated fat or trans fats.  Check the ingredients list for added sugars, such as corn syrup. Shopping  At the grocery store, buy most of your food from the areas near the walls of the store. This includes: ? Fresh fruits and vegetables (produce). ? Grains, beans, nuts, and seeds. Some of these may be available in unpackaged forms or large amounts (in bulk). ? Fresh seafood. ? Poultry and eggs. ? Low-fat dairy products.  Buy whole ingredients instead of prepackaged foods.  Buy fresh fruits and vegetables in-season from local farmers markets.  Buy frozen fruits and vegetables in resealable bags.  If you do not have access to quality fresh seafood, buy precooked frozen shrimp or canned fish, such as tuna, salmon, or sardines.  Buy small amounts of raw or cooked vegetables, salads, or olives from the  deli or salad bar at your store.  Stock your pantry so you always have certain foods on hand, such as olive oil, canned tuna, canned tomatoes, rice, pasta, and beans. Cooking  Cook foods with extra-virgin olive oil instead of using butter or other vegetable oils.  Have meat as a side dish, and have vegetables or grains as your main dish. This means having meat in small portions or adding small amounts of meat to foods like pasta or stew.  Use beans or vegetables instead of meat in common dishes like chili or lasagna.  Experiment with different cooking methods. Try roasting or broiling vegetables instead of steaming or sauteing them.  Add frozen vegetables to soups, stews, pasta, or rice.  Add nuts or seeds for added healthy fat at each meal. You can add these to yogurt, salads, or vegetable dishes.  Marinate fish or vegetables using olive oil, lemon juice, garlic, and fresh herbs. Meal planning  Plan to eat 1 vegetarian meal one day each week. Try to work up to 2 vegetarian meals, if possible.  Eat seafood 2 or more times a week.  Have healthy snacks readily available, such as: ? Vegetable sticks with hummus. ? Greek yogurt. ? Fruit and nut trail mix.  Eat balanced meals throughout the week. This includes: ? Fruit: 2-3 servings a day ? Vegetables: 4-5 servings a day ? Low-fat dairy: 2 servings a day ? Fish, poultry, or lean meat: 1 serving a day ? Beans and legumes: 2 or more servings a week ? Nuts   and seeds: 1-2 servings a day ? Whole grains: 6-8 servings a day ? Extra-virgin olive oil: 3-4 servings a day  Limit red meat and sweets to only a few servings a month What are my food choices?  Mediterranean diet ? Recommended ? Grains: Whole-grain pasta. Brown rice. Bulgar wheat. Polenta. Couscous. Whole-wheat bread. [place]. ? Vegetables: Artichokes. Beets. Broccoli. Cabbage. Carrots. Eggplant. Green beans. Chard. Kale. Spinach. Onions. Leeks. Peas. Squash.  Tomatoes. Peppers. Radishes. ? Fruits: Apples. Apricots. Avocado. Berries. Bananas. Cherries. Dates. Figs. Grapes. Lemons. Melon. Oranges. Peaches. Plums. Pomegranate. ? Meats and other protein foods: Beans. Almonds. Sunflower seeds. Pine nuts. Peanuts. [place]. Salmon. Scallops. Shrimp. [place]. Tilapia. Clams. Oysters. Eggs. ? Dairy: Low-fat milk. Cheese. Greek yogurt. ? Beverages: Water. Red wine. Herbal tea. ? Fats and oils: Extra virgin olive oil. Avocado oil. Grape seed oil. ? Sweets and desserts: [place] yogurt with honey. Baked apples. Poached pears. Trail mix. ? Seasoning and other foods: Basil. Cilantro. Coriander. Cumin. Mint. Parsley. Sage. Rosemary. Tarragon. Garlic. Oregano. Thyme. Pepper. Balsalmic vinegar. Tahini. Hummus. Tomato sauce. Olives. Mushrooms. ? Limit these ? Grains: Prepackaged pasta or rice dishes. Prepackaged cereal with added sugar. ? Vegetables: Deep fried potatoes (french fries). ? Fruits: Fruit canned in syrup. ? Meats and other protein foods: Beef. Pork. Lamb. Poultry with skin. Hot dogs. Berniece Salines. ? Dairy: Ice cream. Sour cream. Whole milk. ? Beverages: Juice. Sugar-sweetened soft drinks. Beer. Liquor and spirits. ? Fats and oils: Butter. Canola oil. Vegetable oil. Beef fat (tallow). Lard. ? Sweets and desserts: Cookies. Cakes. Pies. Candy. ? Seasoning and other foods: Mayonnaise. Premade sauces and marinades. ? The items listed may not be a complete list. Talk with your dietitian about what dietary choices are right for you. Summary  The Mediterranean diet includes both food and lifestyle choices.  Eat a variety of fresh fruits and vegetables, beans, nuts, seeds, and whole grains.  Limit the amount of red meat and sweets that you eat.  Talk with your health care provider about whether it is safe for you to drink red wine in moderation. This means 1 glass a day for nonpregnant women and 2 glasses a day for men. A glass of wine equals 5 oz (150 mL). This information  is not intended to replace advice given to you by your health care provider. Make sure you discuss any questions you have with your health care provider. Document Released: 09/25/2015 Document Revised: 10/28/2015 Document Reviewed: 09/25/2015 Elsevier Interactive Patient Education  2018 [employer].   Exercising to [employer] Exercising can help you to lose weight. In order to lose weight through exercise, you need to do vigorous-intensity exercise. You can tell that you are exercising with vigorous intensity if you are breathing very hard and fast and cannot hold a conversation while exercising. Moderate-intensity exercise helps to maintain your current weight. You can tell that you are exercising at a moderate level if you have a higher heart rate and faster breathing, but you are still able to hold a conversation. How often should I exercise? Choose an activity that you enjoy and set realistic goals. Your health care provider can help you to make an activity plan that works for you. Exercise regularly as directed by your health care provider. This may include:  Doing resistance training twice each week, such as: ? Push-ups. ? Sit-ups. ? Lifting weights. ? Using resistance bands.  Doing a given intensity of exercise for a given amount of time. Choose from these options: ? 150  minutes of moderate-intensity exercise every week. ? 75 minutes of vigorous-intensity exercise every week. ? A mix of moderate-intensity and vigorous-intensity exercise every week.  Children, pregnant women, people who are out of shape, people who are overweight, and older adults may need to consult a health care provider for individual recommendations. If you have any sort of medical condition, be sure to consult your health care provider before starting a new exercise program. What are some activities that can help me to lose weight?  Walking at a rate of at least 4.5 miles an hour.  Jogging or running at a  rate of 5 miles per hour.  Biking at a rate of at least 10 miles per hour.  Lap swimming.  Roller-skating or in-line skating.  Cross-country skiing.  Vigorous competitive sports, such as football, basketball, and soccer.  Jumping rope.  Aerobic dancing. How can I be more active in my day-to-day activities?  Use the stairs instead of the elevator.  Take a walk during your lunch break.  If you drive, park your car farther away from work or school.  If you take public transportation, get off one stop early and walk the rest of the way.  Make all of your phone calls while standing up and walking around.  Get up, stretch, and walk around every 30 minutes throughout the day. What guidelines should I follow while exercising?  Do not exercise so much that you hurt yourself, feel dizzy, or get very short of breath.  Consult your health care provider prior to starting a new exercise program.  Wear comfortable clothes and shoes with good support.  Drink plenty of water while you exercise to prevent dehydration or heat stroke. Body water is lost during exercise and must be replaced.  Work out until you breathe faster and your heart beats faster. This information is not intended to replace advice given to you by your health care provider. Make sure you discuss any questions you have with your health care provider. Document Released: 03/06/2010 Document Revised: 07/10/2015 Document Reviewed: 07/05/2013 Elsevier Interactive Patient Education  2018 [employer]  Continue Fluoxetine 40mg  once daily. Please start Phentermine 37.5mg  once each am. Increase water intake, strive for at least 120 ounces/day.   Follow Heart Healthy diet Increase regular exercise.  Recommend at least 30 minutes daily, 5 days per week of walking, jogging, biking, swimming, YouTube/Pinterest workout videos. Please follow-up in 3-4 weeks. WELCOME TO THE PRACTICE! BOOK CLUB RULES!

## 2017-07-19 NOTE — Assessment & Plan Note (Addendum)
Current BMI 36.72 Current wt 236 lbs [place] Controlled Substance Database reviewed- no aberrancies noted. Started on Phentermine 37.5mg  Increase water intake, strive for at least 120 ounces/day.   Follow Heart Healthy diet Increase regular exercise.  Recommend at least 30 minutes daily, 5 days per week of walking, jogging, biking, swimming, YouTube/Pinterest workout videos. Please follow-up in 3-4 weeks.

## 2017-07-19 NOTE — Progress Notes (Signed)
Subjective:    Patient ID: Crystal Camacho, female    DOB: [birth year], 42 y.o.   MRN: [id number]  HPI:  Crystal Camacho is here to establish as a new pt.  She is a pleasant 42 year old female. PMH: Hypothyroidism (currently on Levothyroxine ), Anxiety/Depression treated with Fluoxetine 40mg  QD, intermittent asthma- rarely requires inhaler, intermittent costochondritis, and obesity. She reports 70 lb wt gain in last 3-4 years She reports mood is stable on Fluoxetine, however "feels like a cloud is above me all the time, but I am hopeful that it will part". 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 She is married with four adopted children  Patient Care Team    Relationship Specialty Notifications Start End  Thomasene Lot, DO PCP - General Family Medicine  07/19/17   Marcelle Overlie, MD Consulting Physician Obstetrics and Gynecology  07/19/17     Patient Active Problem List   Diagnosis Date Noted  . BMI 36.0-36.9,adult 07/19/2017  . Healthcare maintenance 07/19/2017  . Depressive disorder 07/15/2017  . Hypothyroidism 07/15/2017     Past Medical History:  Diagnosis Date  . Anemia   . Bronchitis   . Depression   . Headache   . Hypothyroidism      Past Surgical History:  Procedure Laterality Date  . DILATION AND CURETTAGE OF UTERUS     x2  . DILITATION & CURRETTAGE/HYSTROSCOPY WITH HYDROTHERMAL ABLATION N/A 03/02/2017   Procedure: DILATATION & CURETTAGE/HYSTEROSCOPY WITH HYDROTHERMAL ABLATION;  Surgeon: Marcelle Overlie, MD;  Location: [facility];  Service: Gynecology;  Laterality: N/A;  . LAPAROSCOPIC TUBAL LIGATION Bilateral 03/02/2017   Procedure: ATTEMPTED LAPAROSCOPY, UNSUCESSFUL TUBAL  LIGATION;  Surgeon: Marcelle Overlie, MD;  Location: [facility];  Service: Gynecology;  Laterality: Bilateral;  . MANDIBLE SURGERY  01/1993  . POLYPECTOMY  03/02/2017   Procedure: POLYPECTOMY;  Surgeon: Marcelle Overlie, MD;  Location: [facility];  Service: Gynecology;;     Family History  Problem Relation Age of Onset  . Depression Mother   . Hypothyroidism Mother   . Hypertension Father   . Hypothyroidism Father   . Healthy Sister   . Healthy Daughter      Social History   Substance and Sexual Activity  Drug Use No     Social History   Substance and Sexual Activity  Alcohol Use Yes  . Alcohol/week: 6.0 oz  . Types: 10 Cans of beer per week     Social History   Tobacco Use  Smoking Status Former Smoker  . Packs/day: 0.50  . Types: Cigarettes  . Last attempt to quit: 2009  . Years since quitting: 10.4  Smokeless Tobacco Never Used     Outpatient Encounter Medications as of 07/19/2017  Medication Sig  . albuterol (PROVENTIL HFA;VENTOLIN HFA) 108 (90 Base) MCG/ACT inhaler Inhale 1 puff into the lungs every 6 (six) hours as needed for wheezing or shortness of breath.  . flintstones complete (FLINTSTONES) 60 MG chewable tablet Chew 1 tablet by mouth daily.  Marland Kitchen FLUoxetine (PROZAC) 40 MG capsule Take 1 capsule (40 mg total) by mouth daily.  Marland Kitchen ibuprofen (ADVIL,MOTRIN) 200 MG tablet Take 600-800 mg by mouth every 6 (six) hours as needed for headache or moderate pain.  Marland Kitchen  levothyroxine (SYNTHROID, LEVOTHROID) 50 MCG tablet Take 50 mcg by mouth daily.  . [DISCONTINUED] FLUoxetine (PROZAC) 20 MG capsule Take 2 capsules by mouth daily.  . phentermine (ADIPEX-P) 37.5 MG tablet Take 1 tablet (37.5 mg total) by mouth daily before breakfast.  . [DISCONTINUED] FLUoxetine (PROZAC) 20 MG capsule Take 40 mg by mouth daily.   No facility-administered encounter medications on file as of 07/19/2017.     Allergies: Patient has no known allergies.  Body mass index is 36.72 kg/m.  Blood  pressure 118/76, pulse 73, height 5' 7.25" (1.708 m), weight 236 lb 3.2 oz (107.1 kg), SpO2 98 %.  Review of Systems  Constitutional: Positive for fatigue. Negative for activity change, appetite change, chills, diaphoresis, fever and unexpected weight change.  HENT: Negative for congestion.   Eyes: Negative for visual disturbance.  Respiratory: Negative for cough, chest tightness, shortness of breath, wheezing and stridor.   Cardiovascular: Negative for chest pain, palpitations and leg swelling.  Gastrointestinal: Negative for abdominal distention, abdominal pain, blood in stool, constipation, diarrhea, nausea and vomiting.  Endocrine: Negative for cold intolerance, heat intolerance, polydipsia, polyphagia and polyuria.  Genitourinary: Negative for difficulty urinating and flank pain.  Musculoskeletal: Positive for back pain.  Skin: Negative for color change, pallor, rash and wound.  Neurological: Positive for headaches. Negative for dizziness.  Hematological: Does not bruise/bleed easily.  Psychiatric/Behavioral: Positive for dysphoric mood. Negative for behavioral problems, confusion, decreased concentration, hallucinations, self-injury, sleep disturbance and suicidal ideas. The patient is not nervous/anxious and is not hyperactive.       Objective:   Physical Exam  Constitutional: She is oriented to person, place, and time. She appears well-developed and well-nourished. No distress.  HENT:  Head: Normocephalic and atraumatic.  Right Ear: External ear normal.  Left Ear: External ear normal.  Mouth/Throat: Oropharynx is clear and moist.  Eyes: Pupils are equal, round, and reactive to light. Conjunctivae and EOM are normal.  Cardiovascular: Normal rate, regular rhythm, normal heart sounds and intact distal pulses.  No murmur heard. Pulmonary/Chest: Effort normal. No stridor. No respiratory distress. She has wheezes. She has no rales. She exhibits no tenderness.  Neurological: She is  alert and oriented to person, place, and time.  Skin: Skin is warm and dry. Capillary refill takes less than 2 seconds. No rash noted. She is not diaphoretic. No erythema. No pallor.  Psychiatric: She has a normal mood and affect. Her behavior is normal. Judgment and thought content normal.  Nursing note and vitals reviewed.     Assessment & Plan:   1. BMI 36.0-36.9,adult   2. Healthcare maintenance   3. Hypothyroidism, unspecified type     Healthcare maintenance Continue Fluoxetine 40mg  once daily. Please start Phentermine 37.5mg  once each am. Increase water intake, strive for at least 120 ounces/day.   Follow Heart Healthy diet Increase regular exercise.  Recommend at least 30 minutes daily, 5 days per week of walking, jogging, biking, swimming, YouTube/Pinterest workout videos. Please follow-up in 3-4 weeks.  Hypothyroidism Last TSH on 09/27/16- 2.640 Currently on Levothyroxine QD  BMI 36.0-36.9,adult [place] Controlled Substance Database reviewed- no aberrancies noted. Started on Phentermine 37.5mg  Increase water intake, strive for at least 120 ounces/day.   Follow Heart Healthy diet Increase regular exercise.  Recommend at least 30 minutes daily, 5 days per week of walking, jogging, biking, swimming, YouTube/Pinterest workout videos. Please follow-up in 3-4 weeks.    FOLLOW-UP:  Return in about 1 month (around 08/16/2017) for Regular Follow Up, Evaluate Medication  Effectiveness, Medical Weight Loss.

## 2017-07-19 NOTE — Assessment & Plan Note (Signed)
Last TSH on 09/27/16- 2.640 Currently on Levothyroxine 50mcq QD

## 2017-08-23 NOTE — Progress Notes (Signed)
Subjective:    Patient ID: Crystal Camacho, female    DOB: 07-04-1975, 42 y.o.   MRN: [id number]  HPI: 07/19/17 OV:  Ms. Bernabe is here to establish as a new pt.  She is a pleasant 42 year old female. PMH: Hypothyroidism (currently on Levothyroxine ), Anxiety/Depression treated with Fluoxetine 40mg  QD, intermittent asthma- rarely requires inhaler, intermittent costochondritis, and obesity. She reports 70 lb wt gain in last 3-4 years She reports mood is stable on Fluoxetine, however "feels like a cloud is above me all the time, but I am hopeful that it will part". 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 She is married with four adopted children  08/24/17 OV: Ms. Chicoine is here for f/u: medical wt loss She has was started on Phentermine 37.5mg  4 weeks ago She estimates to drink >80 oz water/day and she has dramatically reduced saturated fat/CHO/sugar. She has been swimming and performing body weight exercises. SHE HAS LOST 11 LBS in 4 WEEKS! Patient Care Team    Relationship Specialty Notifications Start End  [name] D, NP PCP - General Family Medicine  08/24/17   Marcelle Overlie, MD Consulting Physician Obstetrics and Gynecology  07/19/17     Patient Active Problem List   Diagnosis Date Noted  . BMI 35.0-35.9,adult 07/19/2017  . Healthcare maintenance 07/19/2017  . Depressive disorder 07/15/2017  . Hypothyroidism 07/15/2017     Past Medical History:  Diagnosis Date  . Anemia   . Bronchitis   . Depression   . Headache   . Hypothyroidism      Past Surgical History:  Procedure Laterality Date  . DILATION AND CURETTAGE OF UTERUS     x2  . DILITATION & CURRETTAGE/HYSTROSCOPY  WITH HYDROTHERMAL ABLATION N/A 03/02/2017   Procedure: DILATATION & CURETTAGE/HYSTEROSCOPY WITH HYDROTHERMAL ABLATION;  Surgeon: Marcelle Overlie, MD;  Location: [facility];  Service: Gynecology;  Laterality: N/A;  . LAPAROSCOPIC TUBAL LIGATION Bilateral 03/02/2017   Procedure: ATTEMPTED LAPAROSCOPY, UNSUCESSFUL TUBAL LIGATION;  Surgeon: Marcelle Overlie, MD;  Location: [facility];  Service: Gynecology;  Laterality: Bilateral;  . MANDIBLE SURGERY  01/1993  . POLYPECTOMY  03/02/2017   Procedure: POLYPECTOMY;  Surgeon: Marcelle Overlie, MD;  Location: [facility];  Service: Gynecology;;     Family History  Problem Relation Age of Onset  . Depression Mother   . Hypothyroidism Mother   . Hypertension Father   . Hypothyroidism Father   . Healthy Sister   . Healthy Daughter      Social History   Substance and Sexual Activity  Drug Use No     Social History   Substance and Sexual Activity  Alcohol Use Yes  . Alcohol/week: 6.0 oz  . Types: 10 Cans of beer per week     Social History   Tobacco Use  Smoking Status Former Smoker  . Packs/day: 0.50  . Types: Cigarettes  . Last attempt to quit: 2009  . Years since quitting: 10.5  Smokeless Tobacco Never Used     Outpatient Encounter Medications as of 08/24/2017  Medication Sig  . albuterol (PROVENTIL HFA;VENTOLIN HFA) 108 (90 Base) MCG/ACT inhaler Inhale 1 puff into the lungs every 6 (six) hours as needed for wheezing or shortness of breath.  Marland Kitchen  flintstones complete (FLINTSTONES) 60 MG chewable tablet Chew 1 tablet by mouth daily.  Marland Kitchen. FLUoxetine (PROZAC) 40 MG capsule Take 1 capsule (40 mg total) by mouth daily.  Marland Kitchen. ibuprofen (ADVIL,MOTRIN) 200 MG tablet Take 600-800 mg by mouth every 6 (six) hours as needed for headache or moderate pain.  Marland Kitchen. levothyroxine (SYNTHROID, LEVOTHROID) 50 MCG tablet Take 50 mcg by mouth daily.  . phentermine (ADIPEX-P) 37.5 MG tablet Take 1 tablet (37.5 mg total) by mouth daily before breakfast.  . [DISCONTINUED]  phentermine (ADIPEX-P) 37.5 MG tablet Take 1 tablet (37.5 mg total) by mouth daily before breakfast.   No facility-administered encounter medications on file as of 08/24/2017.     Allergies: Patient has no known allergies.  Body mass index is 35.01 kg/m.  Blood pressure 111/74, pulse 71, height 5' 7.25" (1.708 m), weight 225 lb 3.2 oz (102.2 kg), SpO2 98 %.  Review of Systems  Constitutional: Positive for fatigue. Negative for activity change, appetite change, chills, diaphoresis, fever and unexpected weight change.  HENT: Negative for congestion.   Eyes: Negative for visual disturbance.  Respiratory: Negative for cough, chest tightness, shortness of breath, wheezing and stridor.   Cardiovascular: Negative for chest pain, palpitations and leg swelling.  Gastrointestinal: Negative for abdominal distention, abdominal pain, blood in stool, constipation, diarrhea, nausea and vomiting.  Endocrine: Negative for cold intolerance, heat intolerance, polydipsia, polyphagia and polyuria.  Genitourinary: Negative for difficulty urinating and flank pain.  Musculoskeletal: Positive for back pain.  Skin: Negative for color change, pallor, rash and wound.  Neurological: Positive for headaches. Negative for dizziness.  Hematological: Does not bruise/bleed easily.  Psychiatric/Behavioral: Positive for dysphoric mood. Negative for behavioral problems, confusion, decreased concentration, hallucinations, self-injury, sleep disturbance and suicidal ideas. The patient is not nervous/anxious and is not hyperactive.       Objective:   Physical Exam  Constitutional: She is oriented to person, place, and time. She appears well-developed and well-nourished. No distress.  HENT:  Head: Normocephalic and atraumatic.  Right Ear: External ear normal.  Left Ear: External ear normal.  Mouth/Throat: Oropharynx is clear and moist.  Eyes: Pupils are equal, round, and reactive to light. Conjunctivae and EOM are normal.   Cardiovascular: Normal rate, regular rhythm, normal heart sounds and intact distal pulses.  No murmur heard. Pulmonary/Chest: Effort normal. No stridor. No respiratory distress. She has wheezes. She has no rales. She exhibits no tenderness.  Neurological: She is alert and oriented to person, place, and time.  Skin: Skin is warm and dry. Capillary refill takes less than 2 seconds. No rash noted. She is not diaphoretic. No erythema. No pallor.  Psychiatric: She has a normal mood and affect. Her behavior is normal. Judgment and thought content normal.  Nursing note and vitals reviewed.     Assessment & Plan:   1. BMI 35.0-35.9,adult     BMI 35.0-35.9,adult She has lost >11 lbs in 4 weeks! Current wt 225 Body mass index is 35.01 kg/m. Increase water intake, strive for at least 100 ounces/day.   Follow Mediterranean diet Increase regular exercise.  Recommend at least 30 minutes daily, 5 days per week of walking, jogging, biking, swimming, YouTube/Pinterest workout videos. Phentermine rx sent in, you may request one additional refill prior to next office visit. Follow-up in 2 months.    FOLLOW-UP:  Return in about 2 months (around 10/25/2017) for Medical Weight Loss.

## 2017-08-24 ENCOUNTER — Ambulatory Visit: Payer: BC Managed Care – PPO | Admitting: Adult Health

## 2017-08-24 VITALS — BP 111/74 | HR 71 | Ht 67.25 in | Wt 225.2 lb

## 2017-08-24 DIAGNOSIS — Z6835 Body mass index (BMI) 35.0-35.9, adult: Secondary | ICD-10-CM

## 2017-08-24 MED ORDER — PHENTERMINE HCL 37.5 MG PO TABS: 37.5000 mg | ORAL_TABLET | Freq: Every day | ORAL | 0 refills | Status: DC

## 2017-08-24 NOTE — Assessment & Plan Note (Signed)
She has lost >11 lbs in 4 weeks! Current wt 225 Body mass index is 35.01 kg/m. Increase water intake, strive for at least 100 ounces/day.   Follow Mediterranean diet Increase regular exercise.  Recommend at least 30 minutes daily, 5 days per week of walking, jogging, biking, swimming, YouTube/Pinterest workout videos. Phentermine rx sent in, you may request one additional refill prior to next office visit. Follow-up in 2 months.

## 2017-08-24 NOTE — Patient Instructions (Signed)
Mediterranean Diet A Mediterranean diet refers to food and lifestyle choices that are based on the traditions of countries located on the [place]. This way of eating has been shown to help prevent certain conditions and improve outcomes for people who have chronic diseases, like kidney disease and heart disease. What are tips for following this plan? Lifestyle  Cook and eat meals together with your family, when possible.  Drink enough fluid to keep your urine clear or pale yellow.  Be physically active every day. This includes: ? Aerobic exercise like running or swimming. ? Leisure activities like gardening, walking, or housework.  Get 7-8 hours of sleep each night.  If recommended by your health care provider, drink red wine in moderation. This means 1 glass a day for nonpregnant women and 2 glasses a day for men. A glass of wine equals 5 oz (150 mL). Reading food labels  Check the serving size of packaged foods. For foods such as rice and pasta, the serving size refers to the amount of cooked product, not dry.  Check the total fat in packaged foods. Avoid foods that have saturated fat or trans fats.  Check the ingredients list for added sugars, such as corn syrup. Shopping  At the grocery store, buy most of your food from the areas near the walls of the store. This includes: ? Fresh fruits and vegetables (produce). ? Grains, beans, nuts, and seeds. Some of these may be available in unpackaged forms or large amounts (in bulk). ? Fresh seafood. ? Poultry and eggs. ? Low-fat dairy products.  Buy whole ingredients instead of prepackaged foods.  Buy fresh fruits and vegetables in-season from local farmers markets.  Buy frozen fruits and vegetables in resealable bags.  If you do not have access to quality fresh seafood, buy precooked frozen shrimp or canned fish, such as tuna, salmon, or sardines.  Buy small amounts of raw or cooked vegetables, salads, or olives from the  deli or salad bar at your store.  Stock your pantry so you always have certain foods on hand, such as olive oil, canned tuna, canned tomatoes, rice, pasta, and beans. Cooking  Cook foods with extra-virgin olive oil instead of using butter or other vegetable oils.  Have meat as a side dish, and have vegetables or grains as your main dish. This means having meat in small portions or adding small amounts of meat to foods like pasta or stew.  Use beans or vegetables instead of meat in common dishes like chili or lasagna.  Experiment with different cooking methods. Try roasting or broiling vegetables instead of steaming or sauteing them.  Add frozen vegetables to soups, stews, pasta, or rice.  Add nuts or seeds for added healthy fat at each meal. You can add these to yogurt, salads, or vegetable dishes.  Marinate fish or vegetables using olive oil, lemon juice, garlic, and fresh herbs. Meal planning  Plan to eat 1 vegetarian meal one day each week. Try to work up to 2 vegetarian meals, if possible.  Eat seafood 2 or more times a week.  Have healthy snacks readily available, such as: ? Vegetable sticks with hummus. ? Greek yogurt. ? Fruit and nut trail mix.  Eat balanced meals throughout the week. This includes: ? Fruit: 2-3 servings a day ? Vegetables: 4-5 servings a day ? Low-fat dairy: 2 servings a day ? Fish, poultry, or lean meat: 1 serving a day ? Beans and legumes: 2 or more servings a week ? Nuts   and seeds: 1-2 servings a day ? Whole grains: 6-8 servings a day ? Extra-virgin olive oil: 3-4 servings a day  Limit red meat and sweets to only a few servings a month What are my food choices?  Mediterranean diet ? Recommended ? Grains: Whole-grain pasta. Brown rice. Bulgar wheat. Polenta. Couscous. Whole-wheat bread. [place]. ? Vegetables: Artichokes. Beets. Broccoli. Cabbage. Carrots. Eggplant. Green beans. Chard. Kale. Spinach. Onions. Leeks. Peas. Squash.  Tomatoes. Peppers. Radishes. ? Fruits: Apples. Apricots. Avocado. Berries. Bananas. Cherries. Dates. Figs. Grapes. Lemons. Melon. Oranges. Peaches. Plums. Pomegranate. ? Meats and other protein foods: Beans. Almonds. Sunflower seeds. Pine nuts. Peanuts. [place]. Salmon. Scallops. Shrimp. [place]. Tilapia. Clams. Oysters. Eggs. ? Dairy: Low-fat milk. Cheese. Greek yogurt. ? Beverages: Water. Red wine. Herbal tea. ? Fats and oils: Extra virgin olive oil. Avocado oil. Grape seed oil. ? Sweets and desserts: [place] yogurt with honey. Baked apples. Poached pears. Trail mix. ? Seasoning and other foods: Basil. Cilantro. Coriander. Cumin. Mint. Parsley. Sage. Rosemary. Tarragon. Garlic. Oregano. Thyme. Pepper. Balsalmic vinegar. Tahini. Hummus. Tomato sauce. Olives. Mushrooms. ? Limit these ? Grains: Prepackaged pasta or rice dishes. Prepackaged cereal with added sugar. ? Vegetables: Deep fried potatoes (french fries). ? Fruits: Fruit canned in syrup. ? Meats and other protein foods: Beef. Pork. Lamb. Poultry with skin. Hot dogs. Berniece Salines. ? Dairy: Ice cream. Sour cream. Whole milk. ? Beverages: Juice. Sugar-sweetened soft drinks. Beer. Liquor and spirits. ? Fats and oils: Butter. Canola oil. Vegetable oil. Beef fat (tallow). Lard. ? Sweets and desserts: Cookies. Cakes. Pies. Candy. ? Seasoning and other foods: Mayonnaise. Premade sauces and marinades. ? The items listed may not be a complete list. Talk with your dietitian about what dietary choices are right for you. Summary  The Mediterranean diet includes both food and lifestyle choices.  Eat a variety of fresh fruits and vegetables, beans, nuts, seeds, and whole grains.  Limit the amount of red meat and sweets that you eat.  Talk with your health care provider about whether it is safe for you to drink red wine in moderation. This means 1 glass a day for nonpregnant women and 2 glasses a day for men. A glass of wine equals 5 oz (150 mL). This information  is not intended to replace advice given to you by your health care provider. Make sure you discuss any questions you have with your health care provider. Document Released: 09/25/2015 Document Revised: 10/28/2015 Document Reviewed: 09/25/2015 Elsevier Interactive Patient Education  2018 [employer].   Exercising to [employer] Exercising can help you to lose weight. In order to lose weight through exercise, you need to do vigorous-intensity exercise. You can tell that you are exercising with vigorous intensity if you are breathing very hard and fast and cannot hold a conversation while exercising. Moderate-intensity exercise helps to maintain your current weight. You can tell that you are exercising at a moderate level if you have a higher heart rate and faster breathing, but you are still able to hold a conversation. How often should I exercise? Choose an activity that you enjoy and set realistic goals. Your health care provider can help you to make an activity plan that works for you. Exercise regularly as directed by your health care provider. This may include:  Doing resistance training twice each week, such as: ? Push-ups. ? Sit-ups. ? Lifting weights. ? Using resistance bands.  Doing a given intensity of exercise for a given amount of time. Choose from these options: ? 150  minutes of moderate-intensity exercise every week. ? 75 minutes of vigorous-intensity exercise every week. ? A mix of moderate-intensity and vigorous-intensity exercise every week.  Children, pregnant women, people who are out of shape, people who are overweight, and older adults may need to consult a health care provider for individual recommendations. If you have any sort of medical condition, be sure to consult your health care provider before starting a new exercise program. What are some activities that can help me to lose weight?  Walking at a rate of at least 4.5 miles an hour.  Jogging or running at a  rate of 5 miles per hour.  Biking at a rate of at least 10 miles per hour.  Lap swimming.  Roller-skating or in-line skating.  Cross-country skiing.  Vigorous competitive sports, such as football, basketball, and soccer.  Jumping rope.  Aerobic dancing. How can I be more active in my day-to-day activities?  Use the stairs instead of the elevator.  Take a walk during your lunch break.  If you drive, park your car farther away from work or school.  If you take public transportation, get off one stop early and walk the rest of the way.  Make all of your phone calls while standing up and walking around.  Get up, stretch, and walk around every 30 minutes throughout the day. What guidelines should I follow while exercising?  Do not exercise so much that you hurt yourself, feel dizzy, or get very short of breath.  Consult your health care provider prior to starting a new exercise program.  Wear comfortable clothes and shoes with good support.  Drink plenty of water while you exercise to prevent dehydration or heat stroke. Body water is lost during exercise and must be replaced.  Work out until you breathe faster and your heart beats faster. This information is not intended to replace advice given to you by your health care provider. Make sure you discuss any questions you have with your health care provider. Document Released: 03/06/2010 Document Revised: 07/10/2015 Document Reviewed: 07/05/2013 Elsevier Interactive Patient Education  2018 [employer]  GREAT GREAT GREAT JOB ON YOUR WEIGHT LOSS! Increase water intake, strive for at least 100 ounces/day.   Follow Mediterranean diet Increase regular exercise.  Recommend at least 30 minutes daily, 5 days per week of walking, jogging, biking, swimming, YouTube/Pinterest workout videos. Phentermine rx sent in, you may request one additional refill prior to next office visit. Follow-up in 2 months. NICE TO SEE YOU!

## 2017-09-25 ENCOUNTER — Other Ambulatory Visit: Payer: Self-pay | Admitting: Adult Health

## 2017-11-01 ENCOUNTER — Encounter: Payer: Self-pay | Admitting: Adult Health

## 2017-11-01 ENCOUNTER — Other Ambulatory Visit: Payer: Self-pay

## 2017-11-01 ENCOUNTER — Ambulatory Visit: Payer: BC Managed Care – PPO | Admitting: Adult Health

## 2017-11-01 VITALS — BP 125/86 | HR 65 | Temp 98.9°F | Ht 67.0 in | Wt 209.0 lb

## 2017-11-01 DIAGNOSIS — Z0000 Encounter for general adult medical examination without abnormal findings: Secondary | ICD-10-CM

## 2017-11-01 DIAGNOSIS — Z6832 Body mass index (BMI) 32.0-32.9, adult: Secondary | ICD-10-CM | POA: Diagnosis not present

## 2017-11-01 NOTE — Progress Notes (Signed)
Subjective:    Patient ID: Crystal Camacho, female    DOB: 04-Jul-1975, 42 y.o.   MRN: [id number]  HPI: 07/19/17 OV:  Ms. Crystal Camacho is here to establish as a new pt.  She is a pleasant 42 year old female. PMH: Hypothyroidism (currently on Levothyroxine 50mcg), Anxiety/Depression treated with Fluoxetine 40mg  QD, intermittent asthma- rarely requires inhaler, intermittent costochondritis, and obesity. She reports 70 lb wt gain in last 3-4 years She reports mood is stable on Fluoxetine, however "feels like a cloud is above me all the time, but I am hopeful that it will part". She estimates to drink 40-50 oz water/day She sleeps 6/7 hrs night, however it is very interrupted due to children waking her up/climbing into bed She denies tobacco/excessive ETOH use She denies regular exercise, however has plans to begin working out [employer] once school lets out for summer (she is Sports administratorhighschool art teacher). She is married with four adopted children  08/24/17 OV: Ms. Crystal Camacho is here for f/u: medical wt loss She has was started on Phentermine 37.5mg  4 weeks ago She estimates to drink >80 oz water/day and she has dramatically reduced saturated fat/CHO/sugar. She has been swimming and performing body weight exercises. SHE HAS LOST 11 LBS in 4 WEEKS!  11/01/17 OV: Ms. Crystal Camacho presents for f/u: medical wt loss She has completed 3 months on Phentermine and lost >22 lbs! She estimates to drink >75 oz water/day, follows heart healthy diet, and attends "[employer] 3 mornings/week She denies CP/dyspnea/palpitations/insomnia She continues to abstain from tobacco, consumes ETOH socially. She reports "I am starting to really feel like myself again". Patient Care Team    Relationship Specialty Notifications Start End  William Hamburgeranford, Katy D, NP PCP - General Family Medicine  08/24/17   Marcelle OverlieGrewal, Michelle, MD Consulting Physician Obstetrics and Gynecology  07/19/17     Patient Active Problem List   Diagnosis Date Noted   . BMI 32.0-32.9,adult 07/19/2017  . Healthcare maintenance 07/19/2017  . Depressive disorder 07/15/2017  . Hypothyroidism 07/15/2017     Past Medical History:  Diagnosis Date  . Anemia   . Bronchitis   . Depression   . Headache   . Hypothyroidism      Past Surgical History:  Procedure Laterality Date  . DILATION AND CURETTAGE OF UTERUS     x2  . DILITATION & CURRETTAGE/HYSTROSCOPY WITH HYDROTHERMAL ABLATION N/A 03/02/2017   Procedure: DILATATION & CURETTAGE/HYSTEROSCOPY WITH HYDROTHERMAL ABLATION;  Surgeon: Marcelle OverlieGrewal, Michelle, MD;  Location: [facility];  Service: Gynecology;  Laterality: N/A;  . LAPAROSCOPIC TUBAL LIGATION Bilateral 03/02/2017   Procedure: ATTEMPTED LAPAROSCOPY, UNSUCESSFUL TUBAL LIGATION;  Surgeon: Marcelle OverlieGrewal, Michelle, MD;  Location: [facility];  Service: Gynecology;  Laterality: Bilateral;  . MANDIBLE SURGERY  01/1993  . POLYPECTOMY  03/02/2017   Procedure: POLYPECTOMY;  Surgeon: Marcelle OverlieGrewal, Michelle, MD;  Location: [facility];  Service: Gynecology;;     Family History  Problem Relation Age of Onset  . Depression Mother   . Hypothyroidism Mother   . Hypertension Father   . Hypothyroidism Father   . Healthy Sister   . Healthy Daughter      Social History   Substance and Sexual Activity  Drug Use No     Social History   Substance and Sexual Activity  Alcohol Use Yes  . Alcohol/week: 10.0 standard drinks  . Types: 10 Cans of beer per week     Social History   Tobacco Use  Smoking Status Former Smoker  . Packs/day: 0.50  .  Types: Cigarettes  . Last attempt to quit: 2009  . Years since quitting: 10.7  Smokeless Tobacco Never Used     Outpatient Encounter Medications as of 11/01/2017  Medication Sig  . albuterol (PROVENTIL HFA;VENTOLIN HFA) 108 (90 Base) MCG/ACT inhaler Inhale 1 puff into the lungs every 6 (six) hours as needed for wheezing or shortness of breath.  . flintstones complete (FLINTSTONES) 60 MG chewable tablet Chew 1 tablet by mouth daily.   Marland Kitchen FLUoxetine (PROZAC) 40 MG capsule Take 1 capsule (40 mg total) by mouth daily.  Marland Kitchen ibuprofen (ADVIL,MOTRIN) 200 MG tablet Take 600-800 mg by mouth every 6 (six) hours as needed for headache or moderate pain.  Marland Kitchen levothyroxine (SYNTHROID, LEVOTHROID) 50 MCG tablet Take 50 mcg by mouth daily.  . phentermine (ADIPEX-P) 37.5 MG tablet TAKE 1 TABLET(37.5 MG) BY MOUTH DAILY BEFORE BREAKFAST   No facility-administered encounter medications on file as of 11/01/2017.     Allergies: Patient has no known allergies.  Body mass index is 32.73 kg/m.  Blood pressure 125/86, pulse 65, temperature 98.9 F (37.2 C), temperature source Oral, height 5\' 7"  (1.702 m), weight 209 lb (94.8 kg), SpO2 98 %.  Review of Systems  Constitutional: Positive for fatigue. Negative for activity change, appetite change, chills, diaphoresis, fever and unexpected weight change.  HENT: Negative for congestion.   Eyes: Negative for visual disturbance.  Respiratory: Negative for cough, chest tightness, shortness of breath, wheezing and stridor.   Cardiovascular: Negative for chest pain, palpitations and leg swelling.  Gastrointestinal: Negative for abdominal distention, abdominal pain, blood in stool, constipation, diarrhea, nausea and vomiting.  Endocrine: Negative for cold intolerance, heat intolerance, polydipsia, polyphagia and polyuria.  Genitourinary: Negative for difficulty urinating and flank pain.  Musculoskeletal: Negative for arthralgias, back pain, gait problem, joint swelling, myalgias, neck pain and neck stiffness.  Skin: Negative for color change, pallor, rash and wound.  Neurological: Positive for headaches. Negative for dizziness.  Hematological: Does not bruise/bleed easily.  Psychiatric/Behavioral: Negative for behavioral problems, confusion, decreased concentration, dysphoric mood, hallucinations, self-injury, sleep disturbance and suicidal ideas. The patient is not nervous/anxious and is not hyperactive.        Objective:   Physical Exam  Constitutional: She is oriented to person, place, and time. She appears well-developed and well-nourished. No distress.  HENT:  Head: Normocephalic and atraumatic.  Right Ear: External ear normal.  Left Ear: External ear normal.  Mouth/Throat: Oropharynx is clear and moist.  Eyes: Pupils are equal, round, and reactive to light. Conjunctivae and EOM are normal.  Cardiovascular: Normal rate, regular rhythm, normal heart sounds and intact distal pulses.  No murmur heard. Pulmonary/Chest: Effort normal. No stridor. No respiratory distress. She has no wheezes. She has no rales. She exhibits no tenderness.  Neurological: She is alert and oriented to person, place, and time.  Skin: Skin is warm and dry. Capillary refill takes less than 2 seconds. No rash noted. She is not diaphoretic. No erythema. No pallor.  Psychiatric: She has a normal mood and affect. Her behavior is normal. Judgment and thought content normal.  Nursing note and vitals reviewed.     Assessment & Plan:   1. BMI 32.0-32.9,adult   2. Healthcare maintenance     BMI 32.0-32.9,adult Body mass index is 32.73 kg/m.  Current wt 209 Lost >22 lbs in 3 months Completed three months of phentermine Will take medication vacation for 4 weeks, then may re-start for final 3 month course. Instructed to send MyChart message if interested in  re-starting. Continue regular exercise and healthy eating. Increase water intake.  Healthcare maintenance F/u 6 months for CPE Schedule fasting lab appt one week prior.    FOLLOW-UP:  Return in about 6 months (around 05/02/2018) for CPE, Fasting Labs.

## 2017-11-01 NOTE — Assessment & Plan Note (Signed)
Body mass index is 32.73 kg/m.  Current wt 209 Lost >22 lbs in 3 months Completed three months of phentermine Will take medication vacation for 4 weeks, then may re-start for final 3 month course. Instructed to send MyChart message if interested in re-starting. Continue regular exercise and healthy eating. Increase water intake.

## 2017-11-01 NOTE — Assessment & Plan Note (Signed)
F/u 6 months for CPE Schedule fasting lab appt one week prior.

## 2017-12-06 ENCOUNTER — Other Ambulatory Visit: Payer: Self-pay | Admitting: Adult Health

## 2017-12-06 MED ORDER — PHENTERMINE HCL 37.5 MG PO TABS: ORAL_TABLET | ORAL | 0 refills | Status: DC

## 2018-02-05 ENCOUNTER — Encounter: Payer: Self-pay | Admitting: Adult Health

## 2018-02-06 ENCOUNTER — Other Ambulatory Visit: Payer: Self-pay

## 2018-02-06 ENCOUNTER — Other Ambulatory Visit: Payer: Self-pay | Admitting: Adult Health

## 2018-02-06 MED ORDER — LEVOTHYROXINE SODIUM 50 MCG PO TABS: 50.0000 ug | ORAL_TABLET | Freq: Every day | ORAL | 1 refills | Status: DC

## 2018-02-06 NOTE — Telephone Encounter (Signed)
We have not prescribed these medications for the patient previously.  Please review and refill if appropriate.  T. Almer Littleton, CMA  

## 2018-03-02 ENCOUNTER — Encounter: Payer: Self-pay | Admitting: Adult Health

## 2018-03-02 ENCOUNTER — Other Ambulatory Visit: Payer: Self-pay

## 2018-03-02 NOTE — Telephone Encounter (Signed)
Per your last OV note, pt is to return 04/2018.  [place] Controlled Substance database reviewed.  No aberrancies noted.  Tiajuana Amass, CMA

## 2018-03-03 MED ORDER — PHENTERMINE HCL 37.5 MG PO TABS: ORAL_TABLET | ORAL | 0 refills | Status: DC

## 2018-04-09 ENCOUNTER — Other Ambulatory Visit: Payer: Self-pay | Admitting: Adult Health

## 2018-04-11 ENCOUNTER — Ambulatory Visit: Payer: BC Managed Care – PPO | Admitting: Adult Health

## 2018-04-11 ENCOUNTER — Encounter: Payer: Self-pay | Admitting: Adult Health

## 2018-04-11 VITALS — BP 116/78 | HR 65 | Temp 98.7°F | Ht 67.0 in | Wt 219.8 lb

## 2018-04-11 DIAGNOSIS — R6889 Other general symptoms and signs: Secondary | ICD-10-CM

## 2018-04-11 DIAGNOSIS — J0100 Acute maxillary sinusitis, unspecified: Secondary | ICD-10-CM

## 2018-04-11 DIAGNOSIS — H6690 Otitis media, unspecified, unspecified ear: Secondary | ICD-10-CM

## 2018-04-11 LAB — POCT INFLUENZA A/B
Influenza A, POC: NEGATIVE
Influenza B, POC: NEGATIVE

## 2018-04-11 MED ORDER — HYDROCOD POLST-CPM POLST ER 10-8 MG/5ML PO SUER: 5.0000 mL | Freq: Two times a day (BID) | ORAL | 0 refills | Status: DC | PRN

## 2018-04-11 MED ORDER — AMOXICILLIN-POT CLAVULANATE 875-125 MG PO TABS: 1.0000 | ORAL_TABLET | Freq: Two times a day (BID) | ORAL | 0 refills | Status: DC

## 2018-04-11 NOTE — Assessment & Plan Note (Signed)
Flu Test Negative Please take Augmentin as directed and Tussionex as needed. Increase fluids and rest. Alternate OTC Acetaminophen and Ibuprofen as needed for fever/discomfort. If symptoms persist at after antibiotic completed, then please call clinic. 

## 2018-04-11 NOTE — Patient Instructions (Signed)
Otitis Media, Adult  Otitis media occurs when there is inflammation and fluid in the middle ear. Your middle ear is a part of the ear that contains bones for hearing as well as air that helps send sounds to your brain. What are the causes? This condition is caused by a blockage in the eustachian tube. This tube drains fluid from the ear to the back of the nose (nasopharynx). A blockage in this tube can be caused by an object or by swelling (edema) in the tube. Problems that can cause a blockage include:  A cold or other upper respiratory infection.  Allergies.  An irritant, such as tobacco smoke.  Enlarged adenoids. The adenoids are areas of soft tissue located high in the back of the throat, behind the nose and the roof of the mouth.  A mass in the nasopharynx.  Damage to the ear caused by pressure changes (barotrauma). What are the signs or symptoms? Symptoms of this condition include:  Ear pain.  A fever.  Decreased hearing.  A headache.  Tiredness (lethargy).  Fluid leaking from the ear.  Ringing in the ear. How is this diagnosed? This condition is diagnosed with a physical exam. During the exam your health care provider will use an instrument called an otoscope to look into your ear and check for redness, swelling, and fluid. He or she will also ask about your symptoms. Your health care provider may also order tests, such as:  A test to check the movement of the eardrum (pneumatic otoscopy). This test is done by squeezing a small amount of air into the ear.  A test that changes air pressure in the middle ear to check how well the eardrum moves and whether the eustachian tube is working (tympanogram). How is this treated? This condition usually goes away on its own within 3-5 days. But if the condition is caused by a bacteria infection and does not go away own its own, or keeps coming back, your health care provider may:  Prescribe antibiotic medicines to treat the  infection.  Prescribe or recommend medicines to control pain. Follow these instructions at home:  Take over-the-counter and prescription medicines only as told by your health care provider.  If you were prescribed an antibiotic medicine, take it as told by your health care provider. Do not stop taking the antibiotic even if you start to feel better.  Keep all follow-up visits as told by your health care provider. This is important. Contact a health care provider if:  You have bleeding from your nose.  There is a lump on your neck.  You are not getting better in 5 days.  You feel worse instead of better. Get help right away if:  You have severe pain that is not controlled with medicine.  You have swelling, redness, or pain around your ear.  You have stiffness in your neck.  A part of your face is paralyzed.  The bone behind your ear (mastoid) is tender when you touch it.  You develop a severe headache. Summary  Otitis media is redness, soreness, and swelling of the middle ear.  This condition usually goes away on its own within 3-5 days.  If the problem does not go away in 3-5 days, your health care provider may prescribe or recommend medicines to treat your symptoms.  If you were prescribed an antibiotic medicine, take it as told by your health care provider. This information is not intended to replace advice given to you by   your health care provider. Make sure you discuss any questions you have with your health care provider. Document Released: 11/07/2003 Document Revised: 01/23/2016 Document Reviewed: 01/23/2016 Elsevier Interactive Patient Education  2019 Elsevier Inc.   Sinusitis, Adult Sinusitis is inflammation of your sinuses. Sinuses are hollow spaces in the bones around your face. Your sinuses are located:  Around your eyes.  In the middle of your forehead.  Behind your nose.  In your cheekbones. Mucus normally drains out of your sinuses. When your  nasal tissues become inflamed or swollen, mucus can become trapped or blocked. This allows bacteria, viruses, and fungi to grow, which leads to infection. Most infections of the sinuses are caused by a virus. Sinusitis can develop quickly. It can last for up to 4 weeks (acute) or for more than 12 weeks (chronic). Sinusitis often develops after a cold. What are the causes? This condition is caused by anything that creates swelling in the sinuses or stops mucus from draining. This includes:  Allergies.  Asthma.  Infection from bacteria or viruses.  Deformities or blockages in your nose or sinuses.  Abnormal growths in the nose (nasal polyps).  Pollutants, such as chemicals or irritants in the air.  Infection from fungi (rare). What increases the risk? You are more likely to develop this condition if you:  Have a weak body defense system (immune system).  Do a lot of swimming or diving.  Overuse nasal sprays.  Smoke. What are the signs or symptoms? The main symptoms of this condition are pain and a feeling of pressure around the affected sinuses. Other symptoms include:  Stuffy nose or congestion.  Thick drainage from your nose.  Swelling and warmth over the affected sinuses.  Headache.  Upper toothache.  A cough that may get worse at night.  Extra mucus that collects in the throat or the back of the nose (postnasal drip).  Decreased sense of smell and taste.  Fatigue.  A fever.  Sore throat.  Bad breath. How is this diagnosed? This condition is diagnosed based on:  Your symptoms.  Your medical history.  A physical exam.  Tests to find out if your condition is acute or chronic. This may include: ? Checking your nose for nasal polyps. ? Viewing your sinuses using a device that has a light (endoscope). ? Testing for allergies or bacteria. ? Imaging tests, such as an MRI or CT scan. In rare cases, a bone biopsy may be done to rule out more serious types  of fungal sinus disease. How is this treated? Treatment for sinusitis depends on the cause and whether your condition is chronic or acute.  If caused by a virus, your symptoms should go away on their own within 10 days. You may be given medicines to relieve symptoms. They include: ? Medicines that shrink swollen nasal passages (topical intranasal decongestants). ? Medicines that treat allergies (antihistamines). ? A spray that eases inflammation of the nostrils (topical intranasal corticosteroids). ? Rinses that help get rid of thick mucus in your nose (nasal saline washes).  If caused by bacteria, your health care provider may recommend waiting to see if your symptoms improve. Most bacterial infections will get better without antibiotic medicine. You may be given antibiotics if you have: ? A severe infection. ? A weak immune system.  If caused by narrow nasal passages or nasal polyps, you may need to have surgery. Follow these instructions at home: Medicines  Take, use, or apply over-the-counter and prescription medicines only as told  by your health care provider. These may include nasal sprays.  If you were prescribed an antibiotic medicine, take it as told by your health care provider. Do not stop taking the antibiotic even if you start to feel better. Hydrate and humidify   Drink enough fluid to keep your urine pale yellow. Staying hydrated will help to thin your mucus.  Use a cool mist humidifier to keep the humidity level in your home above 50%.  Inhale steam for 10-15 minutes, 3-4 times a day, or as told by your health care provider. You can do this in the bathroom while a hot shower is running.  Limit your exposure to cool or dry air. Rest  Rest as much as possible.  Sleep with your head raised (elevated).  Make sure you get enough sleep each night. General instructions   Apply a warm, moist washcloth to your face 3-4 times a day or as told by your health care  provider. This will help with discomfort.  Wash your hands often with soap and water to reduce your exposure to germs. If soap and water are not available, use hand sanitizer.  Do not smoke. Avoid being around people who are smoking (secondhand smoke).  Keep all follow-up visits as told by your health care provider. This is important. Contact a health care provider if:  You have a fever.  Your symptoms get worse.  Your symptoms do not improve within 10 days. Get help right away if:  You have a severe headache.  You have persistent vomiting.  You have severe pain or swelling around your face or eyes.  You have vision problems.  You develop confusion.  Your neck is stiff.  You have trouble breathing. Summary  Sinusitis is soreness and inflammation of your sinuses. Sinuses are hollow spaces in the bones around your face.  This condition is caused by nasal tissues that become inflamed or swollen. The swelling traps or blocks the flow of mucus. This allows bacteria, viruses, and fungi to grow, which leads to infection.  If you were prescribed an antibiotic medicine, take it as told by your health care provider. Do not stop taking the antibiotic even if you start to feel better.  Keep all follow-up visits as told by your health care provider. This is important. This information is not intended to replace advice given to you by your health care provider. Make sure you discuss any questions you have with your health care provider. Document Released: 02/01/2005 Document Revised: 07/04/2017 Document Reviewed: 07/04/2017 Elsevier Interactive Patient Education  2019 [employer] Inc.   Flu Test Negative Please take Augmentin as directed and Tussionex as needed. Increase fluids and rest. Alternate OTC Acetaminophen and Ibuprofen as needed for fever/discomfort. If symptoms persist at after antibiotic completed, then please call clinic. FEEL BETTER!

## 2018-04-11 NOTE — Assessment & Plan Note (Signed)
Flu Test Negative  

## 2018-04-11 NOTE — Assessment & Plan Note (Signed)
Flu Test Negative Please take Augmentin as directed and Tussionex as needed. Increase fluids and rest. Alternate OTC Acetaminophen and Ibuprofen as needed for fever/discomfort. If symptoms persist at after antibiotic completed, then please call clinic.

## 2018-04-11 NOTE — Progress Notes (Signed)
Subjective:    Patient ID: Crystal Camacho, female    DOB: 08-29-1975, 43 y.o.   MRN: [id number]  HPI:  Crystal Camacho presents with constant non-productive cough, unilateral facial pain/pressure (L side 6/10), L ear pain (6/10), body aches, and sore throat (4/10), and post nasal gtt that all started >1.5 weeks ago. 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 Flu-like symptoms 04/11/2018  . Acute otitis media 04/11/2018  . Acute maxillary sinusitis 04/11/2018  . BMI 32.0-32.9,adult 07/19/2017  . Healthcare maintenance 07/19/2017  . Depressive disorder 07/15/2017  . Hypothyroidism 07/15/2017     Past Medical History:  Diagnosis Date  . Anemia   . Bronchitis   . Depression   . Headache   . Hypothyroidism      Past Surgical History:  Procedure Laterality Date  . DILATION AND CURETTAGE OF UTERUS     x2  . DILITATION & CURRETTAGE/HYSTROSCOPY WITH HYDROTHERMAL ABLATION N/A 03/02/2017   Procedure: DILATATION & CURETTAGE/HYSTEROSCOPY WITH HYDROTHERMAL ABLATION;  Surgeon: Marcelle OverlieGrewal, Michelle, MD;  Location: [facility];  Service: Gynecology;  Laterality: N/A;  . LAPAROSCOPIC TUBAL LIGATION Bilateral 03/02/2017   Procedure: ATTEMPTED LAPAROSCOPY, UNSUCESSFUL TUBAL LIGATION;  Surgeon: Marcelle OverlieGrewal, Michelle, MD;  Location: [facility];  Service: Gynecology;  Laterality: Bilateral;  . MANDIBLE SURGERY  01/1993  . POLYPECTOMY  03/02/2017   Procedure:  POLYPECTOMY;  Surgeon: Marcelle OverlieGrewal, Michelle, MD;  Location: [facility];  Service: Gynecology;;     Family History  Problem Relation Age of Onset  . Depression Mother   . Hypothyroidism Mother   . Hypertension Father   . Hypothyroidism Father   . Healthy Sister   . Healthy Daughter      Social History   Substance and Sexual Activity  Drug Use No     Social History   Substance and Sexual Activity  Alcohol Use Yes  . Alcohol/week: 10.0 standard drinks  . Types: 10 Cans of beer per week     Social History   Tobacco Use  Smoking Status Former Smoker  . Packs/day: 0.50  . Types: Cigarettes  . Last attempt to quit: 2009  . Years since quitting: 11.1  Smokeless Tobacco Never Used     Outpatient Encounter Medications as of 04/11/2018  Medication Sig  . albuterol (PROVENTIL HFA;VENTOLIN HFA) 108 (90 Base) MCG/ACT inhaler Inhale 1 puff into the lungs every 6 (six) hours as needed for wheezing or shortness of breath.  . flintstones complete (FLINTSTONES) 60 MG chewable tablet Chew 1 tablet by mouth daily.  Marland Kitchen. FLUoxetine (PROZAC) 40 MG capsule Take 1 capsule (40 mg total) by mouth daily.  Marland Kitchen. ibuprofen (ADVIL,MOTRIN) 200 MG tablet Take 600-800 mg by mouth every 6 (six) hours as needed for headache or moderate pain.  Marland Kitchen. levothyroxine (SYNTHROID, LEVOTHROID) 50 MCG tablet TAKE 1 TABLET(50 MCG) BY MOUTH DAILY  . amoxicillin-clavulanate (AUGMENTIN) 875-125 MG tablet Take 1 tablet by mouth 2 (two) times  daily.  . chlorpheniramine-HYDROcodone (TUSSIONEX) 10-8 MG/5ML SUER Take 5 mLs by mouth every 12 (twelve) hours as needed.  . [DISCONTINUED] phentermine (ADIPEX-P) 37.5 MG tablet TAKE 1 TABLET(37.5 MG) BY MOUTH DAILY BEFORE BREAKFAST   No facility-administered encounter medications on file as of 04/11/2018.     Allergies: Patient has no known allergies.  Body mass index is 34.43 kg/m.  Blood pressure 116/78, pulse 65, temperature 98.7 F (37.1 C), temperature source Oral, height 5\' 7"   (1.702 m), weight 219 lb 12.8 oz (99.7 kg), SpO2 99 %.  Review of Systems  Constitutional: Positive for fatigue. Negative for activity change, appetite change, chills, diaphoresis, fever and unexpected weight change.  HENT: Positive for ear pain, facial swelling, postnasal drip, sinus pressure, sore throat and voice change. Negative for congestion, rhinorrhea, sinus pain, sneezing and trouble swallowing.   Eyes: Negative for visual disturbance.  Respiratory: Positive for cough. Negative for chest tightness, shortness of breath, wheezing and stridor.   Cardiovascular: Negative for chest pain, palpitations and leg swelling.  Gastrointestinal: Negative for abdominal distention, abdominal pain, blood in stool, constipation, diarrhea, nausea and vomiting.  Endocrine: Negative for cold intolerance, heat intolerance, polydipsia, polyphagia and polyuria.  Genitourinary: Negative for difficulty urinating and flank pain.  Skin: Negative for color change, pallor, rash and wound.  Neurological: Negative for dizziness and headaches.  Hematological: Does not bruise/bleed easily.  Psychiatric/Behavioral: Positive for sleep disturbance.       Objective:   Physical Exam Vitals signs and nursing note reviewed.  Constitutional:      Appearance: She is ill-appearing. She is not toxic-appearing or diaphoretic.  HENT:     Head: Normocephalic and atraumatic.     Right Ear: No decreased hearing noted. Tympanic membrane is not erythematous or bulging.     Left Ear: No decreased hearing noted. Tympanic membrane is erythematous and bulging.     Nose: Nasal tenderness, mucosal edema and rhinorrhea present.     Right Turbinates: Enlarged and swollen.     Left Turbinates: Enlarged and swollen.     Right Sinus: No maxillary sinus tenderness or frontal sinus tenderness.     Left Sinus: Maxillary sinus tenderness and frontal sinus tenderness present.     Mouth/Throat:     Pharynx: Posterior oropharyngeal erythema  present.     Tonsils: No tonsillar exudate. Swelling: 1+ on the right. 1+ on the left.  Eyes:     Extraocular Movements: Extraocular movements intact.     Conjunctiva/sclera: Conjunctivae normal.     Pupils: Pupils are equal, round, and reactive to light.  Neck:     Musculoskeletal: Normal range of motion.  Cardiovascular:     Rate and Rhythm: Normal rate.     Pulses: Normal pulses.     Heart sounds: Normal heart sounds. No murmur. No friction rub. No gallop.   Pulmonary:     Effort: Pulmonary effort is normal. No respiratory distress.     Breath sounds: Normal breath sounds. No stridor. No wheezing, rhonchi or rales.  Chest:     Chest wall: No tenderness.  Lymphadenopathy:     Cervical: No cervical adenopathy.  Skin:    General: Skin is warm and dry.     Capillary Refill: Capillary refill takes less than 2 seconds.  Neurological:     Mental Status: She is alert and oriented to person, place, and time.  Psychiatric:        Mood and Affect: Mood normal.        Behavior:  Behavior normal.        Thought Content: Thought content normal.        Judgment: Judgment normal.        Assessment & Plan:   1. Flu-like symptoms   2. Acute otitis media, unspecified otitis media type   3. Acute maxillary sinusitis, recurrence not specified     Acute maxillary sinusitis Flu Test Negative Please take Augmentin as directed and Tussionex as needed. Increase fluids and rest. Alternate OTC Acetaminophen and Ibuprofen as needed for fever/discomfort. If symptoms persist at after antibiotic completed, then please call clinic.  Flu-like symptoms Flu Test Negative   Acute otitis media Flu Test Negative Please take Augmentin as directed and Tussionex as needed. Increase fluids and rest. Alternate OTC Acetaminophen and Ibuprofen as needed for fever/discomfort. If symptoms persist at after antibiotic completed, then please call clinic.    FOLLOW-UP:  Return if symptoms worsen or fail  to improve.

## 2018-04-12 ENCOUNTER — Other Ambulatory Visit: Payer: Self-pay | Admitting: Adult Health

## 2018-04-26 NOTE — Progress Notes (Deleted)
Subjective:    Patient ID: Crystal Camacho, female    DOB: Oct 11, 1975, 43 y.o.   MRN: [id number]  HPI:  Ms. Champeau presents for CPE  Reviewed Recent Labs:  Healthcare Maintenance: PAP- Mammogram- Immunizations-  Patient Care Team    Relationship Specialty Notifications Start End  Julaine Fusi, NP PCP - General Family Medicine  08/24/17   Marcelle Overlie, MD Consulting Physician Obstetrics and Gynecology  07/19/17     Patient Active Problem List   Diagnosis Date Noted  . Flu-like symptoms 04/11/2018  . Acute otitis media 04/11/2018  . Acute maxillary sinusitis 04/11/2018  . BMI 32.0-32.9,adult 07/19/2017  . Healthcare maintenance 07/19/2017  . Depressive disorder 07/15/2017  . Hypothyroidism 07/15/2017     Past Medical History:  Diagnosis Date  . Anemia   . Bronchitis   . Depression   . Headache   . Hypothyroidism      Past Surgical History:  Procedure Laterality Date  . DILATION AND CURETTAGE OF UTERUS     x2  . DILITATION & CURRETTAGE/HYSTROSCOPY WITH HYDROTHERMAL ABLATION N/A 03/02/2017   Procedure: DILATATION & CURETTAGE/HYSTEROSCOPY WITH HYDROTHERMAL ABLATION;  Surgeon: Marcelle Overlie, MD;  Location: [facility];  Service: Gynecology;  Laterality: N/A;  . LAPAROSCOPIC TUBAL LIGATION Bilateral 03/02/2017   Procedure: ATTEMPTED LAPAROSCOPY, UNSUCESSFUL TUBAL LIGATION;  Surgeon: Marcelle Overlie, MD;  Location: [facility];  Service: Gynecology;  Laterality: Bilateral;  . MANDIBLE SURGERY  01/1993  . POLYPECTOMY  03/02/2017   Procedure: POLYPECTOMY;  Surgeon: Marcelle Overlie, MD;  Location: [facility];  Service: Gynecology;;     Family History  Problem Relation Age of Onset  . Depression Mother   . Hypothyroidism Mother   . Hypertension Father   . Hypothyroidism Father   . Healthy Sister   . Healthy Daughter      Social History   Substance and Sexual Activity  Drug Use No     Social History   Substance and Sexual Activity  Alcohol Use Yes  .  Alcohol/week: 10.0 standard drinks  . Types: 10 Cans of beer per week     Social History   Tobacco Use  Smoking Status Former Smoker  . Packs/day: 0.50  . Types: Cigarettes  . Last attempt to quit: 2009  . Years since quitting: 11.1  Smokeless Tobacco Never Used     Outpatient Encounter Medications as of 05/02/2018  Medication Sig  . albuterol (PROVENTIL HFA;VENTOLIN HFA) 108 (90 Base) MCG/ACT inhaler Inhale 1 puff into the lungs every 6 (six) hours as needed for wheezing or shortness of breath.  Marland Kitchen amoxicillin-clavulanate (AUGMENTIN) 875-125 MG tablet Take 1 tablet by mouth 2 (two) times daily.  . chlorpheniramine-HYDROcodone (TUSSIONEX) 10-8 MG/5ML SUER Take 5 mLs by mouth every 12 (twelve) hours as needed.  . flintstones complete (FLINTSTONES) 60 MG chewable tablet Chew 1 tablet by mouth daily.  Marland Kitchen FLUoxetine (PROZAC) 40 MG capsule TAKE 1 CAPSULE(40 MG) BY MOUTH DAILY  . ibuprofen (ADVIL,MOTRIN) 200 MG tablet Take 600-800 mg by mouth every 6 (six) hours as needed for headache or moderate pain.  Marland Kitchen levothyroxine (SYNTHROID, LEVOTHROID) 50 MCG tablet TAKE 1 TABLET(50 MCG) BY MOUTH DAILY   No facility-administered encounter medications on file as of 05/02/2018.     Allergies: Patient has no known allergies.  There is no height or weight on file to calculate BMI.  There were no vitals taken for this visit.     Review of Systems     Objective:   Physical  Exam        Assessment & Plan:  No diagnosis found.  No problem-specific Assessment & Plan notes found for this encounter.    FOLLOW-UP:  No follow-ups on file.

## 2018-04-28 ENCOUNTER — Other Ambulatory Visit: Payer: Self-pay

## 2018-04-28 ENCOUNTER — Other Ambulatory Visit: Payer: BC Managed Care – PPO

## 2018-04-28 DIAGNOSIS — Z0000 Encounter for general adult medical examination without abnormal findings: Secondary | ICD-10-CM

## 2018-04-28 DIAGNOSIS — E039 Hypothyroidism, unspecified: Secondary | ICD-10-CM

## 2018-04-29 LAB — CBC WITH DIFFERENTIAL/PLATELET
BASOS ABS: 0 10*3/uL (ref 0.0–0.2)
Basos: 0 %
EOS (ABSOLUTE): 0.2 10*3/uL (ref 0.0–0.4)
Eos: 3 %
HEMOGLOBIN: 13.3 g/dL (ref 11.1–15.9)
Hematocrit: 40.1 % (ref 34.0–46.6)
Immature Grans (Abs): 0 10*3/uL (ref 0.0–0.1)
Immature Granulocytes: 1 %
Lymphocytes Absolute: 1.2 10*3/uL (ref 0.7–3.1)
Lymphs: 20 %
MCH: 29.9 pg (ref 26.6–33.0)
MCHC: 33.2 g/dL (ref 31.5–35.7)
MCV: 90 fL (ref 79–97)
Monocytes Absolute: 0.4 10*3/uL (ref 0.1–0.9)
Monocytes: 7 %
Neutrophils Absolute: 4.4 10*3/uL (ref 1.4–7.0)
Neutrophils: 69 %
Platelets: 244 10*3/uL (ref 150–450)
RBC: 4.45 x10E6/uL (ref 3.77–5.28)
RDW: 13.2 % (ref 11.7–15.4)
WBC: 6.2 10*3/uL (ref 3.4–10.8)

## 2018-04-29 LAB — COMPREHENSIVE METABOLIC PANEL
ALK PHOS: 61 IU/L (ref 39–117)
ALT: 20 IU/L (ref 0–32)
AST: 20 IU/L (ref 0–40)
Albumin/Globulin Ratio: 2.3 — ABNORMAL HIGH (ref 1.2–2.2)
Albumin: 4.6 g/dL (ref 3.8–4.8)
BUN/Creatinine Ratio: 15 (ref 9–23)
BUN: 12 mg/dL (ref 6–24)
Bilirubin Total: 0.3 mg/dL (ref 0.0–1.2)
CO2: 20 mmol/L (ref 20–29)
Calcium: 9.9 mg/dL (ref 8.7–10.2)
Chloride: 103 mmol/L (ref 96–106)
Creatinine, Ser: 0.8 mg/dL (ref 0.57–1.00)
GFR calc Af Amer: 106 mL/min/{1.73_m2} (ref >59)
GFR calc non Af Amer: 92 mL/min/{1.73_m2} (ref >59)
Globulin, Total: 2 g/dL (ref 1.5–4.5)
Glucose: 96 mg/dL (ref 65–99)
Potassium: 4.9 mmol/L (ref 3.5–5.2)
Sodium: 137 mmol/L (ref 134–144)
Total Protein: 6.6 g/dL (ref 6.0–8.5)

## 2018-04-29 LAB — HEMOGLOBIN A1C
Est. average glucose Bld gHb Est-mCnc: 108 mg/dL
Hgb A1c MFr Bld: 5.4 % (ref 4.8–5.6)

## 2018-04-29 LAB — LIPID PANEL
Chol/HDL Ratio: 2.5 ratio (ref 0.0–4.4)
Cholesterol, Total: 186 mg/dL (ref 100–199)
HDL: 75 mg/dL (ref >39)
LDL Calculated: 99 mg/dL (ref 0–99)
Triglycerides: 58 mg/dL (ref 0–149)
VLDL Cholesterol Cal: 12 mg/dL (ref 5–40)

## 2018-04-29 LAB — TSH: TSH: 2.09 u[IU]/mL (ref 0.450–4.500)

## 2018-05-02 ENCOUNTER — Encounter: Payer: BC Managed Care – PPO | Admitting: Adult Health

## 2018-05-13 ENCOUNTER — Encounter: Payer: Self-pay | Admitting: Adult Health

## 2018-05-15 ENCOUNTER — Telehealth: Payer: Self-pay

## 2018-05-15 ENCOUNTER — Other Ambulatory Visit: Payer: Self-pay | Admitting: Adult Health

## 2018-05-15 MED ORDER — ALBUTEROL SULFATE HFA 108 (90 BASE) MCG/ACT IN AERS: 1.0000 | INHALATION_SPRAY | Freq: Four times a day (QID) | RESPIRATORY_TRACT | 3 refills | Status: AC | PRN

## 2018-05-15 NOTE — Telephone Encounter (Signed)
Message   Hi!   Allergy season has hit. Could you refill my inhaler? I'm feeling tight in my chest. No other symptoms-- just typical allergy symptoms.    We have not prescribed these medications for the patient previously.  Please review and refill if appropriate.  Tiajuana Amass, CMA

## 2018-05-17 ENCOUNTER — Encounter: Payer: Self-pay | Admitting: Adult Health

## 2018-05-17 ENCOUNTER — Other Ambulatory Visit: Payer: Self-pay

## 2018-05-17 MED ORDER — LEVOTHYROXINE SODIUM 50 MCG PO TABS: ORAL_TABLET | ORAL | 2 refills | Status: DC

## 2018-06-15 ENCOUNTER — Encounter: Payer: BC Managed Care – PPO | Admitting: Adult Health

## 2018-07-18 ENCOUNTER — Other Ambulatory Visit: Payer: Self-pay | Admitting: Adult Health

## 2018-07-29 ENCOUNTER — Encounter: Payer: Self-pay | Admitting: Adult Health

## 2018-08-03 ENCOUNTER — Other Ambulatory Visit: Payer: Self-pay | Admitting: Adult Health

## 2018-08-03 ENCOUNTER — Encounter: Payer: Self-pay | Admitting: Adult Health

## 2018-08-03 MED ORDER — AMOXICILLIN-POT CLAVULANATE 875-125 MG PO TABS: 1.0000 | ORAL_TABLET | Freq: Two times a day (BID) | ORAL | 0 refills | Status: DC

## 2018-08-25 ENCOUNTER — Encounter: Payer: Self-pay | Admitting: Adult Health

## 2018-08-25 ENCOUNTER — Other Ambulatory Visit: Payer: Self-pay | Admitting: Adult Health

## 2018-08-25 MED ORDER — LEVOTHYROXINE SODIUM 50 MCG PO TABS: ORAL_TABLET | ORAL | 0 refills | Status: DC

## 2018-08-25 MED ORDER — FLUOXETINE HCL 40 MG PO CAPS: ORAL_CAPSULE | ORAL | 0 refills | Status: DC

## 2018-08-30 ENCOUNTER — Telehealth: Payer: Self-pay | Admitting: Adult Health

## 2018-08-30 NOTE — Telephone Encounter (Signed)
Called patient to set up appt for CPE, but was hung up on twice.  --FYI to medical assistant.  -glh

## 2018-08-30 NOTE — Telephone Encounter (Signed)
-----   Message from Jerilee Field, [place] sent at 07/18/2018  3:22 PM EDT ----- Patient is due for CPE, please call the patient to make an appointment.  Thanks. MPulliam, CMA/RT(R)

## 2018-10-26 ENCOUNTER — Other Ambulatory Visit: Payer: Self-pay

## 2018-10-26 ENCOUNTER — Telehealth: Payer: BC Managed Care – PPO | Admitting: Physician Assistant

## 2018-10-26 DIAGNOSIS — Z20822 Contact with and (suspected) exposure to covid-19: Secondary | ICD-10-CM

## 2018-10-26 NOTE — Progress Notes (Signed)
E-Visit for Corona Virus Screening Your current symptoms could be consistent with the coronavirus.  Many health care providers can now test patients at their office but not all are.  [facility] has multiple testing sites. For information on our COVID testing locations and hours go to [email or web address]  Please quarantine yourself while awaiting your test results.  We are enrolling you in our [facility] for [place] . Daily you will receive a questionnaire within the [facility] website. Our COVID 19 response team willl be monitoriing your responses daily.    COVID-19 is a respiratory illness with symptoms that are similar to the flu. Symptoms are typically mild to moderate, but there have been cases of severe illness and death due to the virus. The following symptoms may appear 2-14 days after exposure: . Fever . Cough . Shortness of breath or difficulty breathing . Chills . Repeated shaking with chills . Muscle pain . Headache . Sore throat . New loss of taste or smell . Fatigue . Congestion or runny nose . Nausea or vomiting . Diarrhea  It is vitally important that if you feel that you have an infection such as this virus or any other virus that you stay home and away from places where you may spread it to others.  You should self-quarantine for 14 days if you have symptoms that could potentially be coronavirus or have been in close contact a with a person diagnosed with COVID-19 within the last 2 weeks. You should avoid contact with people age 42 and older.   You should wear a mask or cloth face covering over your nose and mouth if you must be around other people or animals, including pets (even at home). Try to stay at least 6 feet away from other people. This will protect the people around you.  You can use medication such as over the counter cough and cold medications, mucinex  You may also take acetaminophen (Tylenol) as needed for  fever.   Reduce your risk of any infection by using the same precautions used for avoiding the common cold or flu:  Marland Kitchen Wash your hands often with soap and warm water for at least 20 seconds.  If soap and water are not readily available, use an alcohol-based hand sanitizer with at least 60% alcohol.  . If coughing or sneezing, cover your mouth and nose by coughing or sneezing into the elbow areas of your shirt or coat, into a tissue or into your sleeve (not your hands). . Avoid shaking hands with others and consider head nods or verbal greetings only. . Avoid touching your eyes, nose, or mouth with unwashed hands.  . Avoid close contact with people who are sick. . Avoid places or events with large numbers of people in one location, like concerts or sporting events. . Carefully consider travel plans you have or are making. . If you are planning any travel outside or inside the [place], visit the CDC's Travelers' Health webpage for the latest health notices. . If you have some symptoms but not all symptoms, continue to monitor at home and seek medical attention if your symptoms worsen. . If you are having a medical emergency, call 911.  HOME CARE . Only take medications as instructed by your medical team. . Drink plenty of fluids and get plenty of rest. . A steam or ultrasonic humidifier can help if you have congestion.   GET HELP RIGHT AWAY IF YOU HAVE EMERGENCY WARNING SIGNS** FOR COVID-19. If you or  someone is showing any of these signs seek emergency medical care immediately. Call 911 or proceed to your closest emergency facility if: . You develop worsening high fever. . Trouble breathing . Bluish lips or face . Persistent pain or pressure in the chest . New confusion . Inability to wake or stay awake . You cough up blood. . Your symptoms become more severe  **This list is not all possible symptoms. Contact your medical provider for any symptoms that are sever or concerning to you.   MAKE  SURE YOU   Understand these instructions.  Will watch your condition.  Will get help right away if you are not doing well or get worse.  Your e-visit answers were reviewed by a board certified advanced clinical practitioner to complete your personal care plan.  Depending on the condition, your plan could have included both over the counter or prescription medications.  If there is a problem please reply once you have received a response from your provider.  Your safety is important to us.  If you have drug allergies check your prescription carefully.    You can use MyChart to ask questions about today's visit, request a non-urgent call back, or ask for a work or school excuse for 24 hours related to this e-Visit. If it has been greater than 24 hours you will need to follow up with your provider, or enter a new e-Visit to address those concerns. You will get an e-mail in the next two days asking about your experience.  I hope that your e-visit has been valuable and will speed your recovery. Thank you for using e-visits.

## 2018-10-27 LAB — NOVEL CORONAVIRUS, NAA: SARS-CoV-2, NAA: NOT DETECTED

## 2019-01-19 ENCOUNTER — Encounter: Payer: Self-pay | Admitting: Adult Health

## 2019-01-22 ENCOUNTER — Other Ambulatory Visit: Payer: Self-pay | Admitting: Adult Health

## 2019-01-22 ENCOUNTER — Encounter: Payer: Self-pay | Admitting: Adult Health

## 2019-01-22 MED ORDER — BUPROPION HCL ER (SR) 150 MG PO TB12: ORAL_TABLET | ORAL | 1 refills | Status: DC

## 2019-01-22 NOTE — Progress Notes (Signed)
wellbutrin SR 150mg  QD for 3 days, then titrate up to BID- hold at this dose F/u 3 weeks TeleMedicine

## 2019-02-11 ENCOUNTER — Other Ambulatory Visit: Payer: Self-pay | Admitting: Adult Health

## 2019-02-21 ENCOUNTER — Encounter: Payer: Self-pay | Admitting: Adult Health

## 2019-02-21 ENCOUNTER — Other Ambulatory Visit: Payer: Self-pay

## 2019-02-21 ENCOUNTER — Ambulatory Visit (INDEPENDENT_AMBULATORY_CARE_PROVIDER_SITE_OTHER): Payer: BC Managed Care – PPO | Admitting: Adult Health

## 2019-02-21 VITALS — Temp 97.8°F | Ht 67.0 in | Wt 215.0 lb

## 2019-02-21 DIAGNOSIS — Z6833 Body mass index (BMI) 33.0-33.9, adult: Secondary | ICD-10-CM

## 2019-02-21 DIAGNOSIS — F329 Major depressive disorder, single episode, unspecified: Secondary | ICD-10-CM | POA: Diagnosis not present

## 2019-02-21 DIAGNOSIS — Z0000 Encounter for general adult medical examination without abnormal findings: Secondary | ICD-10-CM

## 2019-02-21 DIAGNOSIS — F32A Depression, unspecified: Secondary | ICD-10-CM

## 2019-02-21 MED ORDER — BUPROPION HCL ER (SR) 150 MG PO TB12: ORAL_TABLET | ORAL | 1 refills | Status: DC

## 2019-02-21 MED ORDER — FLUOXETINE HCL 20 MG PO CAPS: 20.0000 mg | ORAL_CAPSULE | Freq: Every day | ORAL | 0 refills | Status: DC

## 2019-02-21 MED ORDER — LEVOTHYROXINE SODIUM 50 MCG PO TABS: ORAL_TABLET | ORAL | 0 refills | Status: DC

## 2019-02-21 NOTE — Assessment & Plan Note (Signed)
Referral to healthy weight and wellness placed Lifestyle encouraged

## 2019-02-21 NOTE — Assessment & Plan Note (Signed)
Assessment and Plan: Reduced fluoxetine dosage to 20mg  QD Continue Wellbutrin SR 150mg  BID Continue regular exercise Recommend Mediterranean diet, increase water intake- dtive to drink at least half of your body wt in ounces/day. Referral to Healthy Weight and Wellness placed. Continue to social distance and wear a mask when in public.  Follow Up Instructions: 4 months CPE with fasting labs   I discussed the assessment and treatment plan with the patient. The patient was provided an opportunity to ask questions and all were answered. The patient agreed with the plan and demonstrated an understanding of the instructions.   The patient was advised to call back or seek an in-person evaluation if the symptoms worsen or if the condition fails to improve as anticipated.

## 2019-02-21 NOTE — Assessment & Plan Note (Signed)
Reduced fluoxetine dosage to 20mg  QD Continue Wellbutrin SR 150mg  BID Continue regular exercise

## 2019-02-21 NOTE — Progress Notes (Signed)
Virtual Visit via Telephone Note  I connected with Crystal Camacho on 02/21/19 at 10:30 AM EST by telephone and verified that I am speaking with the correct person using two identifiers.  Location: Patient:Home Provider: In Clinic   I discussed the limitations, risks, security and privacy concerns of performing an evaluation and management service by telephone and the availability of in person appointments. I also discussed with the patient that there may be a patient responsible charge related to this service. The patient expressed understanding and agreed to proceed.   History of Present Illness: Crystal Camacho calls in for Depressive Disorder. She has been on Fluoxetine 40mg  QD and felt that her sx's were not well controlled. She ran out of Fluoxetine 40mg  >1 week. She was started Wellbutrin SR 150mg - she has titrated up to BID. She reports "reduction of the black cloud and I am yelling a lot less". She denies SI/HI Last 4 months- exercising 4-5 times per week Rowing 10 mins Elliptical 30 mins Daily walking >1 mile/day Goal >12K steps/day, currently 7-8K step/day Cal counting - strives 1500 cal However she still continues to gain wt- current wt 215 Body mass index is 33.67 kg/m.  She is agreeable to referral to [facility].  Her husband continues to travel heavily for work, but he plans to start travelling ever other week-which we will help with children (4 school age children- 33 of which are learning remotely).  She denies tobacco/vape/excessive ETOH use  Depression screen [facility] 2/9 02/21/2019 04/11/2018 11/01/2017  Decreased Interest 1 0 0  Down, Depressed, Hopeless 1 1 0  PHQ - 2 Score 2 1 0  Altered sleeping 3 0 1  Tired, decreased energy 1 2 1   Change in appetite 0 0 0  Feeling bad or failure about yourself  1 0 0  Trouble concentrating 0 0 0  Moving slowly or fidgety/restless 1 0 0  Suicidal thoughts 0 0 0  PHQ-9 Score 8 3 2   Difficult doing work/chores  Somewhat difficult Not difficult at all Not difficult at all   Patient Care Team    Relationship Specialty Notifications Start End  Crystal Grandchild, NP PCP - General Family Medicine  08/24/17   Crystal Queen, MD Consulting Physician Obstetrics and Gynecology  07/19/17     Patient Active Problem List   Diagnosis Date Noted  . Flu-like symptoms 04/11/2018  . Acute otitis media 04/11/2018  . Acute maxillary sinusitis 04/11/2018  . BMI 32.0-32.9,adult 07/19/2017  . Healthcare maintenance 07/19/2017  . Depressive disorder 07/15/2017  . Hypothyroidism 07/15/2017     Past Medical History:  Diagnosis Date  . Anemia   . Bronchitis   . Depression   . Headache   . Hypothyroidism      Past Surgical History:  Procedure Laterality Date  . DILATION AND CURETTAGE OF UTERUS     x2  . DILITATION & CURRETTAGE/HYSTROSCOPY WITH HYDROTHERMAL ABLATION N/A 03/02/2017   Procedure: DILATATION & CURETTAGE/HYSTEROSCOPY WITH HYDROTHERMAL ABLATION;  Surgeon: Crystal Queen, MD;  Location: [facility];  Service: Gynecology;  Laterality: N/A;  . LAPAROSCOPIC TUBAL LIGATION Bilateral 03/02/2017   Procedure: ATTEMPTED LAPAROSCOPY, UNSUCESSFUL TUBAL LIGATION;  Surgeon: Crystal Queen, MD;  Location: [facility];  Service: Gynecology;  Laterality: Bilateral;  . MANDIBLE SURGERY  01/1993  . POLYPECTOMY  03/02/2017   Procedure: POLYPECTOMY;  Surgeon: Crystal Queen, MD;  Location: [facility];  Service: Gynecology;;     Family History  Problem Relation Age of Onset  . Depression  Mother   . Hypothyroidism Mother   . Hypertension Father   . Hypothyroidism Father   . Healthy Sister   . Healthy Daughter      Social History   Substance and Sexual Activity  Drug Use No     Social History   Substance and Sexual Activity  Alcohol Use Yes  . Alcohol/week: 10.0 standard drinks  . Types: 10 Cans of beer per week     Social History   Tobacco Use  Smoking Status Former Smoker  . Packs/day: 0.50  .  Types: Cigarettes  . Quit date: 2009  . Years since quitting: 12.0  Smokeless Tobacco Never Used     Outpatient Encounter Medications as of 02/21/2019  Medication Sig  . albuterol (PROVENTIL HFA;VENTOLIN HFA) 108 (90 Base) MCG/ACT inhaler Inhale 1 puff into the lungs every 6 (six) hours as needed for wheezing or shortness of breath.  Marland Kitchen buPROPion (WELLBUTRIN SR) 150 MG 12 hr tablet One tablet by mouth daily for three days, then increase to twice daily.  Marland Kitchen ibuprofen (ADVIL,MOTRIN) 200 MG tablet Take 600-800 mg by mouth every 6 (six) hours as needed for headache or moderate pain.  Marland Kitchen levothyroxine (SYNTHROID) 50 MCG tablet TAKE 1 TABLET(50 MCG) BY MOUTH DAILY  . Multiple Vitamin (MULTIVITAMIN) tablet Take 1 tablet by mouth daily.  Marland Kitchen FLUoxetine (PROZAC) 40 MG capsule TAKE 1 CAPSULE(40 MG) BY MOUTH DAILY (Patient not taking: Reported on 02/21/2019)  . [DISCONTINUED] flintstones complete (FLINTSTONES) 60 MG chewable tablet Chew 1 tablet by mouth daily.   No facility-administered encounter medications on file as of 02/21/2019.    Allergies: Patient has no known allergies.  Body mass index is 33.67 kg/m.  Temperature 97.8 F (36.6 C), temperature source Temporal, height 5\' 7"  (1.702 m), weight 215 lb (97.5 kg). Review of Systems: General:   Denies fever, chills, unexplained weight loss.  Optho/Auditory:   Denies visual changes, blurred vision/LOV Respiratory:   Denies SOB, DOE more than baseline levels.  Cardiovascular:   Denies chest pain, palpitations, new onset peripheral edema  Gastrointestinal:   Denies nausea, vomiting, diarrhea.  Genitourinary: Denies dysuria, freq/ urgency, flank pain or discharge from genitals.  Endocrine:     Denies hot or cold intolerance, polyuria, polydipsia. Musculoskeletal:   Denies unexplained myalgias, joint swelling, unexplained arthralgias, gait problems.  Skin:  Denies rash, suspicious lesions Neurological: Denies dizziness, unexplained weakness, numbness   Psychiatric/Behavioral:   Denies mood changes, suicidal or homicidal ideations, hallucinations   Observations/Objective: No acute distress noted during the telephone conversation.  Assessment and Plan: Reduced fluoxetine dosage to 20mg  QD Continue Wellbutrin SR 150mg  BID Continue regular exercise Recommend Mediterranean diet, increase water intake- dtive to drink at least half of your body wt in ounces/day. Referral to [facility] placed. Continue to social distance and wear a mask when in public.  Follow Up Instructions: 4 months CPE with fasting labs   I discussed the assessment and treatment plan with the patient. The patient was provided an opportunity to ask questions and all were answered. The patient agreed with the plan and demonstrated an understanding of the instructions.   The patient was advised to call back or seek an in-person evaluation if the symptoms worsen or if the condition fails to improve as anticipated.  I provided 22  minutes of non-face-to-face time during this encounter.   , NP

## 2019-04-19 ENCOUNTER — Telehealth: Payer: Self-pay | Admitting: Adult Health

## 2019-04-19 NOTE — Telephone Encounter (Signed)
Patient called states DOS 02/21/19 chrgs denied for coding error.  --Forwarding message to Joselyn Glassman E for review, correction & re-submittal. & also to call pt back w/ status @ [phone].  -glh

## 2019-05-02 ENCOUNTER — Other Ambulatory Visit: Payer: Self-pay

## 2019-05-02 ENCOUNTER — Ambulatory Visit (INDEPENDENT_AMBULATORY_CARE_PROVIDER_SITE_OTHER): Payer: BC Managed Care – PPO | Admitting: Family Medicine

## 2019-05-02 ENCOUNTER — Encounter (INDEPENDENT_AMBULATORY_CARE_PROVIDER_SITE_OTHER): Payer: Self-pay | Admitting: Family Medicine

## 2019-05-02 VITALS — BP 116/71 | HR 73 | Temp 98.1°F | Ht 68.0 in | Wt 213.0 lb

## 2019-05-02 DIAGNOSIS — F418 Other specified anxiety disorders: Secondary | ICD-10-CM

## 2019-05-02 DIAGNOSIS — Z9189 Other specified personal risk factors, not elsewhere classified: Secondary | ICD-10-CM | POA: Diagnosis not present

## 2019-05-02 DIAGNOSIS — Z0289 Encounter for other administrative examinations: Secondary | ICD-10-CM

## 2019-05-02 DIAGNOSIS — R5383 Other fatigue: Secondary | ICD-10-CM | POA: Diagnosis not present

## 2019-05-02 DIAGNOSIS — Z6832 Body mass index (BMI) 32.0-32.9, adult: Secondary | ICD-10-CM

## 2019-05-02 DIAGNOSIS — E039 Hypothyroidism, unspecified: Secondary | ICD-10-CM | POA: Diagnosis not present

## 2019-05-02 DIAGNOSIS — R0602 Shortness of breath: Secondary | ICD-10-CM

## 2019-05-02 DIAGNOSIS — E669 Obesity, unspecified: Secondary | ICD-10-CM

## 2019-05-02 NOTE — Progress Notes (Signed)
Dear Crystal Hamburger, NP,   Thank you for referring Crystal Camacho to our clinic. The following note includes my evaluation and treatment recommendations.  Chief Complaint:   OBESITY Crystal Camacho (MR# [id number]) is a 44 y.o. female who presents for evaluation and treatment of obesity and related comorbidities. Current BMI is Body mass index is 32.39 kg/m. Crystal Camacho has been struggling with her weight for many years and has been unsuccessful in either losing weight, maintaining weight loss, or reaching her healthy weight goal.  Crystal Camacho is currently in the action stage of change and ready to dedicate time achieving and maintaining a healthier weight. Crystal Camacho is interested in becoming our patient and working on intensive lifestyle modifications including (but not limited to) diet and exercise for weight loss.  Crystal Camacho reports that she previously tracked her calories around 1600 per day.  For breakfast she has a smoothie (whey protein powder, banana, almond milk, frozen berries), coffee with creamer, and bread with 2-3 tbsp of peanut butter (feels satisfied).  For lunch she reports having a salad and crackers (snack container of Cheez Its and apple) and she will feel satisfied.  For a snack she will have crackers.  She reports having rice (3/4 cup) with protein (2 ounces) with salad (1.5 cup).  After dinner she will have ice cream or crackers.  Crystal Camacho's habits were reviewed today and are as follows: Her family eats meals together, she thinks her family will eat healthier with her, her desired weight loss is 48 pounds, she started gaining weight 3-4 years ago, her heaviest weight ever was 237 pounds, she snacks frequently in the evenings, she skips breakfast or lunch occasionally, she is frequently drinking liquids with calories, she sometimes makes poor food choices, she sometimes eats larger portions than normal and she struggles with emotional eating.  Depression Screen Crystal Camacho's Food  and Mood (modified PHQ-9) score was 5.  Depression screen PHQ 2/9 05/02/2019  Decreased Interest 1  Down, Depressed, Hopeless 1  PHQ - 2 Score 2  Altered sleeping 0  Tired, decreased energy 1  Change in appetite 1  Feeling bad or failure about yourself  0  Trouble concentrating 1  Moving slowly or fidgety/restless 0  Suicidal thoughts 0  PHQ-9 Score 5  Difficult doing work/chores Somewhat difficult   Subjective:   1. Other fatigue Crystal Camacho admits to daytime somnolence and denies waking up still tired. Patent has a history of symptoms of morning fatigue. Crystal Camacho generally gets 6 or 7 hours of sleep per night, and states that she has generally restful sleep. Snoring is present. Apneic episodes are not present. Epworth Sleepiness Score is 11.  2. Shortness of breath on exertion Crystal Camacho notes increasing shortness of breath with exercising and seems to be worsening over time with weight gain. She notes getting out of breath sooner with activity than she used to. This has not gotten worse recently. Crystal Camacho denies shortness of breath at rest or orthopnea.  3. Hypothyroidism, unspecified type Crystal Camacho is taking levothyroxine 50 mcg daily.  She denies cold or heat intolerance or palpitations.  Lab Results  Component Value Date   TSH 2.090 04/28/2018   4. Depression with anxiety Crystal Camacho is struggling with emotional eating and using food for comfort to the extent that it is negatively impacting her health. She has been working on behavior modification techniques to help reduce her emotional eating and has been unsuccessful. She shows no sign of suicidal or homicidal ideations.  She is taking  Prozac and Wellbutrin.  Wellbutrin was added recently.  5. At risk for osteoporosis Crystal Camacho is at higher risk of osteopenia and osteoporosis due to Vitamin D deficiency.   Assessment/Plan:   1. Other fatigue Crystal Camacho does feel that her weight is causing her energy to be lower than it  should be. Fatigue may be related to obesity, depression or many other causes. Labs will be ordered, and in the meanwhile, Crystal Camacho will focus on self care including making healthy food choices, increasing physical activity and focusing on stress reduction. - EKG 12-Lead - Vitamin B12 - CBC with Differential/Platelet - Comprehensive metabolic panel - VITAMIN D 25 Hydroxy (Vit-D Deficiency, Fractures) - Hemoglobin A1c - Insulin, random - Folate  2. Shortness of breath on exertion Crystal Camacho does not feel that she gets out of breath more easily that she used to when she exercises. Crystal Camacho's shortness of breath appears to be obesity related and exercise induced. She has agreed to work on weight loss and gradually increase exercise to treat her exercise induced shortness of breath. Will continue to monitor closely. - Lipid Panel With LDL/HDL Ratio  3. Hypothyroidism, unspecified type Patient with long-standing hypothyroidism, on levothyroxine therapy. She appears euthyroid. Orders and follow up as documented in patient record.  Counseling . Good thyroid control is important for overall health. Supratherapeutic thyroid levels are dangerous and will not improve weight loss results. . The correct way to take levothyroxine is fasting, with water, separated by at least 30 minutes from breakfast, and separated by more than 4 hours from calcium, iron, multivitamins, acid reflux medications (PPIs).  - T3 - T4, free - TSH  4. Depression with anxiety Behavior modification techniques were discussed today to help Crystal Camacho deal with her emotional/non-hunger eating behaviors.  Orders and follow up as documented in patient record.   5. At risk for osteoporosis Crystal Camacho was given approximately 15 minutes of osteoporosis prevention counseling today. Crystal Camacho is at risk for osteopenia and osteoporosis due to her Vitamin D deficiency. She was encouraged to take her Vitamin D and follow her higher calcium  diet and increase strengthening exercise to help strengthen her bones and decrease her risk of osteopenia and osteoporosis.  Repetitive spaced learning was employed today to elicit superior memory formation and behavioral change.  6. Class 1 obesity with serious comorbidity and body mass index (BMI) of 32.0 to 32.9 in adult, unspecified obesity type Crystal Camacho is currently in the action stage of change and her goal is to continue with weight loss efforts. I recommend Crystal Camacho begin the structured treatment plan as follows:  She has agreed to the [employer].  Exercise goals: As is.   Behavioral modification strategies: increasing lean protein intake, increasing vegetables, meal planning and cooking strategies and planning for success.  She was informed of the importance of frequent follow-up visits to maximize her success with intensive lifestyle modifications for her multiple health conditions. She was informed we would discuss her lab results at her next visit unless there is a critical issue that needs to be addressed sooner. Crystal Camacho agreed to keep her next visit at the agreed upon time to discuss these results.  Objective:   Blood pressure 116/71, pulse 73, temperature 98.1 F (36.7 C), temperature source Oral, height 5\' 8"  (1.727 m), weight 213 lb (96.6 kg), SpO2 97 %. Body mass index is 32.39 kg/m.  EKG: Normal sinus rhythm, rate 75 bpm.  Indirect Calorimeter completed today shows a VO2 of 272 and a REE of 1895.  Her calculated  basal metabolic rate is 1950 thus her basal metabolic rate is better than expected.  General: Cooperative, alert, well developed, in no acute distress. HEENT: Conjunctivae and lids unremarkable. Cardiovascular: Regular rhythm.  Lungs: Normal work of breathing. Neurologic: No focal deficits.   Lab Results  Component Value Date   CREATININE 0.80 04/28/2018   BUN 12 04/28/2018   NA 137 04/28/2018   K 4.9 04/28/2018   CL 103 04/28/2018   CO2 20  04/28/2018   Lab Results  Component Value Date   ALT 20 04/28/2018   AST 20 04/28/2018   ALKPHOS 61 04/28/2018   BILITOT 0.3 04/28/2018   Lab Results  Component Value Date   HGBA1C 5.4 04/28/2018   Lab Results  Component Value Date   TSH 2.090 04/28/2018   Lab Results  Component Value Date   CHOL 186 04/28/2018   HDL 75 04/28/2018   LDLCALC 99 04/28/2018   TRIG 58 04/28/2018   CHOLHDL 2.5 04/28/2018   Lab Results  Component Value Date   WBC 6.2 04/28/2018   HGB 13.3 04/28/2018   HCT 40.1 04/28/2018   MCV 90 04/28/2018   PLT 244 04/28/2018   Attestation Statements:   This is the patient's first visit at [facility]. The patient's NEW PATIENT PACKET was reviewed at length. Included in the packet: current and past health history, medications, allergies, ROS, gynecologic history (women only), surgical history, family history, social history, weight history, weight loss surgery history (for those that have had weight loss surgery), nutritional evaluation, mood and food questionnaire, PHQ9, Epworth questionnaire, sleep habits questionnaire, patient life and health improvement goals questionnaire. These will all be scanned into the patient's chart under media.   During the visit, I independently reviewed the patient's EKG, bioimpedance scale results, and indirect calorimeter results. I used this information to tailor a meal plan for the patient that will help her to lose weight and will improve her obesity-related conditions going forward. I performed a medically necessary appropriate examination and/or evaluation. I discussed the assessment and treatment plan with the patient. The patient was provided an opportunity to ask questions and all were answered. The patient agreed with the plan and demonstrated an understanding of the instructions. Labs were ordered at this visit and will be reviewed at the next visit unless more critical results need to be addressed  immediately. Clinical information was updated and documented in the EMR.   Time spent on visit including pre-visit chart review and post-visit care was 45 minutes.   A separate 15 minutes was spent on risk counseling (see above).    I, Water quality scientist, CMA, am acting as transcriptionist for Coralie Common, MD. I have reviewed the above documentation for accuracy and completeness, and I agree with the above. - Ilene Qua, MD

## 2019-05-03 LAB — COMPREHENSIVE METABOLIC PANEL
ALT: 18 IU/L (ref 0–32)
AST: 21 IU/L (ref 0–40)
Albumin/Globulin Ratio: 2.5 — ABNORMAL HIGH (ref 1.2–2.2)
Albumin: 4.7 g/dL (ref 3.8–4.8)
Alkaline Phosphatase: 60 IU/L (ref 39–117)
BUN/Creatinine Ratio: 13 (ref 9–23)
BUN: 10 mg/dL (ref 6–24)
Bilirubin Total: 0.2 mg/dL (ref 0.0–1.2)
CO2: 22 mmol/L (ref 20–29)
Calcium: 9.5 mg/dL (ref 8.7–10.2)
Chloride: 104 mmol/L (ref 96–106)
Creatinine, Ser: 0.76 mg/dL (ref 0.57–1.00)
GFR calc Af Amer: 112 mL/min/{1.73_m2} (ref >59)
GFR calc non Af Amer: 97 mL/min/{1.73_m2} (ref >59)
Globulin, Total: 1.9 g/dL (ref 1.5–4.5)
Glucose: 86 mg/dL (ref 65–99)
Potassium: 4.5 mmol/L (ref 3.5–5.2)
Sodium: 139 mmol/L (ref 134–144)
Total Protein: 6.6 g/dL (ref 6.0–8.5)

## 2019-05-03 LAB — CBC WITH DIFFERENTIAL/PLATELET
Basophils Absolute: 0 10*3/uL (ref 0.0–0.2)
Basos: 0 %
EOS (ABSOLUTE): 0 10*3/uL (ref 0.0–0.4)
Eos: 0 %
Hematocrit: 39.1 % (ref 34.0–46.6)
Hemoglobin: 13.2 g/dL (ref 11.1–15.9)
Immature Grans (Abs): 0 10*3/uL (ref 0.0–0.1)
Immature Granulocytes: 0 %
Lymphocytes Absolute: 0.9 10*3/uL (ref 0.7–3.1)
Lymphs: 18 %
MCH: 31.3 pg (ref 26.6–33.0)
MCHC: 33.8 g/dL (ref 31.5–35.7)
MCV: 93 fL (ref 79–97)
Monocytes Absolute: 0.3 10*3/uL (ref 0.1–0.9)
Monocytes: 6 %
Neutrophils Absolute: 3.8 10*3/uL (ref 1.4–7.0)
Neutrophils: 76 %
Platelets: 240 10*3/uL (ref 150–450)
RBC: 4.22 x10E6/uL (ref 3.77–5.28)
RDW: 11.6 % — ABNORMAL LOW (ref 11.7–15.4)
WBC: 5 10*3/uL (ref 3.4–10.8)

## 2019-05-03 LAB — TSH: TSH: 2.41 u[IU]/mL (ref 0.450–4.500)

## 2019-05-03 LAB — LIPID PANEL WITH LDL/HDL RATIO
Cholesterol, Total: 187 mg/dL (ref 100–199)
HDL: 76 mg/dL (ref >39)
LDL Chol Calc (NIH): 96 mg/dL (ref 0–99)
LDL/HDL Ratio: 1.3 ratio (ref 0.0–3.2)
Triglycerides: 83 mg/dL (ref 0–149)
VLDL Cholesterol Cal: 15 mg/dL (ref 5–40)

## 2019-05-03 LAB — VITAMIN B12: Vitamin B-12: 456 pg/mL (ref 232–1245)

## 2019-05-03 LAB — HEMOGLOBIN A1C
Est. average glucose Bld gHb Est-mCnc: 105 mg/dL
Hgb A1c MFr Bld: 5.3 % (ref 4.8–5.6)

## 2019-05-03 LAB — T4, FREE: Free T4: 1.05 ng/dL (ref 0.82–1.77)

## 2019-05-03 LAB — INSULIN, RANDOM: INSULIN: 12.4 u[IU]/mL (ref 2.6–24.9)

## 2019-05-03 LAB — T3: T3, Total: 86 ng/dL (ref 71–180)

## 2019-05-03 LAB — FOLATE: Folate: 17.6 ng/mL (ref >3.0)

## 2019-05-03 LAB — VITAMIN D 25 HYDROXY (VIT D DEFICIENCY, FRACTURES): Vit D, 25-Hydroxy: 46.7 ng/mL (ref 30.0–100.0)

## 2019-05-16 ENCOUNTER — Other Ambulatory Visit: Payer: Self-pay

## 2019-05-16 ENCOUNTER — Ambulatory Visit (INDEPENDENT_AMBULATORY_CARE_PROVIDER_SITE_OTHER): Payer: BC Managed Care – PPO | Admitting: Family Medicine

## 2019-05-16 ENCOUNTER — Encounter (INDEPENDENT_AMBULATORY_CARE_PROVIDER_SITE_OTHER): Payer: Self-pay | Admitting: Family Medicine

## 2019-05-16 VITALS — BP 114/70 | HR 70 | Temp 98.5°F | Ht 68.0 in | Wt 212.0 lb

## 2019-05-16 DIAGNOSIS — Z6832 Body mass index (BMI) 32.0-32.9, adult: Secondary | ICD-10-CM

## 2019-05-16 DIAGNOSIS — E8881 Metabolic syndrome: Secondary | ICD-10-CM

## 2019-05-16 DIAGNOSIS — E559 Vitamin D deficiency, unspecified: Secondary | ICD-10-CM

## 2019-05-16 DIAGNOSIS — E669 Obesity, unspecified: Secondary | ICD-10-CM

## 2019-05-17 ENCOUNTER — Encounter (INDEPENDENT_AMBULATORY_CARE_PROVIDER_SITE_OTHER): Payer: Self-pay | Admitting: Family Medicine

## 2019-05-17 NOTE — Telephone Encounter (Signed)
Please advise 

## 2019-05-17 NOTE — Progress Notes (Signed)
Chief Complaint:   OBESITY Crystal Camacho is here to discuss her progress with her obesity treatment plan along with follow-up of her obesity related diagnoses. Crystal Camacho is on the [employer] + 300 calories and states she is following her eating plan approximately 50-60% of the time. Crystal Camacho states she is exercising on the elliptical or walking 30 minutes 4-5 times per week.  Today's visit was #: 2 Starting weight: 213 lbs Starting date: 05/02/2019 Today's weight: 212 lbs Today's date: 05/16/2019 Total lbs lost to date: 1 Total lbs lost since last in-office visit: 1  Interim History: Crystal Camacho voices she did a significant amount of emotional eating - often eating cheese and dairy products. She was cognitive of her actions, but did not refrain from emotional eating. She enjoyed vegetarian options and enjoyed fish.  Subjective:   Insulin resistance. Crystal Camacho has a diagnosis of insulin resistance based on her elevated fasting insulin level >5. She continues to work on diet and exercise to decrease her risk of diabetes. She reports cravings for dairy and cheese.  Lab Results  Component Value Date   INSULIN 12.4 05/02/2019   Lab Results  Component Value Date   HGBA1C 5.3 05/02/2019   Vitamin D deficiency. Crystal Camacho is on Vitamin D 2,000 IU daily. No nausea, vomiting, or muscle weakness. She endorses fatigue. Last Vitamin D 46.7 on 05/02/2019.  Assessment/Plan:   Insulin resistance. Crystal Camacho will continue to work on weight loss, exercise, and decreasing simple carbohydrates to help decrease the risk of diabetes. Crystal Camacho agreed to follow-up with [place] as directed to closely monitor her progress. Will recheck labs in 3 months. No medications at this time.  Vitamin D deficiency. Low Vitamin D level contributes to fatigue and are associated with obesity, breast, and colon cancer. She will increase her Vitamin D from 2,000 IU daily to 5,000 IU daily and will follow-up for routine  testing of Vitamin D, at least 2-3 times per year to avoid over-replacement.  Class 1 obesity with serious comorbidity and body mass index (BMI) of 32.0 to 32.9 in adult, unspecified obesity type.  Crystal Camacho is currently in the action stage of change. As such, her goal is to continue with weight loss efforts. She has agreed to the Category 3 Plan or the Pescatarian plan + 300 calories.   Exercise goals: Jaliza will continue her current exercise regimen.  Behavioral modification strategies: increasing lean protein intake, increasing vegetables, meal planning and cooking strategies, keeping healthy foods in the home and planning for success.  Crystal Camacho has agreed to follow-up with our clinic in 2 weeks. She was informed of the importance of frequent follow-up visits to maximize her success with intensive lifestyle modifications for her multiple health conditions.   Objective:   Blood pressure 114/70, pulse 70, temperature 98.5 F (36.9 C), temperature source Oral, height 5\' 8"  (1.727 m), weight 212 lb (96.2 kg), last menstrual period 05/13/2019, SpO2 96 %. Body mass index is 32.23 kg/m.  General: Cooperative, alert, well developed, in no acute distress. HEENT: Conjunctivae and lids unremarkable. Cardiovascular: Regular rhythm.  Lungs: Normal work of breathing. Neurologic: No focal deficits.   Lab Results  Component Value Date   CREATININE 0.76 05/02/2019   BUN 10 05/02/2019   NA 139 05/02/2019   K 4.5 05/02/2019   CL 104 05/02/2019   CO2 22 05/02/2019   Lab Results  Component Value Date   ALT 18 05/02/2019   AST 21 05/02/2019   ALKPHOS 60 05/02/2019  BILITOT 0.2 05/02/2019   Lab Results  Component Value Date   HGBA1C 5.3 05/02/2019   HGBA1C 5.4 04/28/2018   Lab Results  Component Value Date   INSULIN 12.4 05/02/2019   Lab Results  Component Value Date   TSH 2.410 05/02/2019   Lab Results  Component Value Date   CHOL 187 05/02/2019   HDL 76 05/02/2019    LDLCALC 96 05/02/2019   TRIG 83 05/02/2019   CHOLHDL 2.5 04/28/2018   Lab Results  Component Value Date   WBC 5.0 05/02/2019   HGB 13.2 05/02/2019   HCT 39.1 05/02/2019   MCV 93 05/02/2019   PLT 240 05/02/2019   No results found for: IRON, TIBC, FERRITIN  Attestation Statements:   Reviewed by clinician on day of visit: allergies, medications, problem list, medical history, surgical history, family history, social history, and previous encounter notes.  Time spent on visit including pre-visit chart review and post-visit charting and care was 30 minutes.   I, Michaelene Song, am acting as transcriptionist for Coralie Common, MD   I have reviewed the above documentation for accuracy and completeness, and I agree with the above. - Ilene Qua, MD

## 2019-05-21 ENCOUNTER — Encounter (INDEPENDENT_AMBULATORY_CARE_PROVIDER_SITE_OTHER): Payer: Self-pay | Admitting: Family Medicine

## 2019-05-21 NOTE — Telephone Encounter (Signed)
Please advise. Thank you

## 2019-05-23 ENCOUNTER — Encounter: Payer: BC Managed Care – PPO | Admitting: Physician Assistant

## 2019-05-23 ENCOUNTER — Other Ambulatory Visit: Payer: BC Managed Care – PPO

## 2019-05-24 ENCOUNTER — Other Ambulatory Visit: Payer: Self-pay | Admitting: Adult Health

## 2019-05-31 ENCOUNTER — Other Ambulatory Visit: Payer: BC Managed Care – PPO

## 2019-06-05 ENCOUNTER — Other Ambulatory Visit: Payer: Self-pay

## 2019-06-05 ENCOUNTER — Ambulatory Visit (INDEPENDENT_AMBULATORY_CARE_PROVIDER_SITE_OTHER): Payer: BC Managed Care – PPO | Admitting: Family Medicine

## 2019-06-05 ENCOUNTER — Encounter (INDEPENDENT_AMBULATORY_CARE_PROVIDER_SITE_OTHER): Payer: Self-pay | Admitting: Family Medicine

## 2019-06-05 VITALS — BP 117/75 | HR 71 | Temp 98.1°F | Ht 68.0 in | Wt 207.0 lb

## 2019-06-05 DIAGNOSIS — E8881 Metabolic syndrome: Secondary | ICD-10-CM

## 2019-06-05 DIAGNOSIS — Z6831 Body mass index (BMI) 31.0-31.9, adult: Secondary | ICD-10-CM | POA: Diagnosis not present

## 2019-06-05 DIAGNOSIS — E669 Obesity, unspecified: Secondary | ICD-10-CM | POA: Diagnosis not present

## 2019-06-05 DIAGNOSIS — E559 Vitamin D deficiency, unspecified: Secondary | ICD-10-CM

## 2019-06-06 ENCOUNTER — Encounter: Payer: BC Managed Care – PPO | Admitting: Physician Assistant

## 2019-06-07 NOTE — Progress Notes (Signed)
Chief Complaint:   OBESITY Crystal Camacho is here to discuss her progress with her obesity treatment plan along with follow-up of her obesity related diagnoses. Crystal Camacho is on the Category 3 Plan or the Pescatarian Plan + 300 calories and states she is following her eating plan approximately 90% of the time. Crystal Camacho states she is on the elliptical for 30 minutes 4-5 times per week, and walking for 1-2 miles 1-2 times per week.  Today's visit was #: 3 Starting weight: 213 lbs Starting date: 05/02/2019 Today's weight: 207 lbs Today's date: 06/05/2019 Total lbs lost to date: 6 Total lbs lost since last in-office visit: 5  Interim History: Crystal Camacho feels she has probably done more Category 3 than Pescatarian. She notes occasional hunger right before bed. She hasn't gotten to 10 oz at dinner yet. She notes snack wise, she is adding mayo to sandwich, mini Kind bars, and yogurt and berries.  Subjective:   1. Vitamin D deficiency Crystal Camacho denies nausea, vomiting, or muscle weakness, but notes fatigue. She is on 2,000 IU daily. Last Vit D level was 46.7.  2. Insulin resistance Crystal Camacho's last Hgb A1c was 5.3 and insulin of 12.4. She notes minimal carbohydrate cravings.  Assessment/Plan:   1. Vitamin D deficiency Low Vitamin D level contributes to fatigue and are associated with obesity, breast, and colon cancer. Crystal Camacho agreed to continue taking OTC Vitamin D and will follow-up for routine testing of Vitamin D, at least 2-3 times per year to avoid over-replacement.  2. Insulin resistance Crystal Camacho will continue to work on weight loss, exercise, and decreasing simple carbohydrates to help decrease the risk of diabetes. We will follow up on labs in 2 months. Crystal Camacho agreed to follow-up with [place] as directed to closely monitor her progress.  3. Class 1 obesity with serious comorbidity and body mass index (BMI) of 31.0 to 31.9 in adult, unspecified obesity type Crystal Camacho is currently in  the action stage of change. As such, her goal is to continue with weight loss efforts. She has agreed to the Category 3 Plan.   Exercise goals: All adults should avoid inactivity. Some physical activity is better than none, and adults who participate in any amount of physical activity gain some health benefits.  Behavioral modification strategies: increasing lean protein intake, increasing vegetables, meal planning and cooking strategies, better snacking choices and planning for success.  Crystal Camacho has agreed to follow-up with our clinic in 2 weeks. She was informed of the importance of frequent follow-up visits to maximize her success with intensive lifestyle modifications for her multiple health conditions.   Objective:   Blood pressure 117/75, pulse 71, temperature 98.1 F (36.7 C), temperature source Oral, height 5\' 8"  (1.727 m), weight 207 lb (93.9 kg), last menstrual period 05/13/2019, SpO2 99 %. Body mass index is 31.47 kg/m.  General: Cooperative, alert, well developed, in no acute distress. HEENT: Conjunctivae and lids unremarkable. Cardiovascular: Regular rhythm.  Lungs: Normal work of breathing. Neurologic: No focal deficits.   Lab Results  Component Value Date   CREATININE 0.76 05/02/2019   BUN 10 05/02/2019   NA 139 05/02/2019   K 4.5 05/02/2019   CL 104 05/02/2019   CO2 22 05/02/2019   Lab Results  Component Value Date   ALT 18 05/02/2019   AST 21 05/02/2019   ALKPHOS 60 05/02/2019   BILITOT 0.2 05/02/2019   Lab Results  Component Value Date   HGBA1C 5.3 05/02/2019   HGBA1C 5.4 04/28/2018   Lab Results  Component Value Date   INSULIN 12.4 05/02/2019   Lab Results  Component Value Date   TSH 2.410 05/02/2019   Lab Results  Component Value Date   CHOL 187 05/02/2019   HDL 76 05/02/2019   LDLCALC 96 05/02/2019   TRIG 83 05/02/2019   CHOLHDL 2.5 04/28/2018   Lab Results  Component Value Date   WBC 5.0 05/02/2019   HGB 13.2 05/02/2019   HCT 39.1  05/02/2019   MCV 93 05/02/2019   PLT 240 05/02/2019   No results found for: IRON, TIBC, FERRITIN  Attestation Statements:   Reviewed by clinician on day of visit: allergies, medications, problem list, medical history, surgical history, family history, social history, and previous encounter notes.  Time spent on visit including pre-visit chart review and post-visit care and charting was 15 minutes.    I, Trixie Dredge, am acting as transcriptionist for April Manson, MD.  I have reviewed the above documentation for accuracy and completeness, and I agree with the above. - Ilene Qua, MD

## 2019-06-26 ENCOUNTER — Encounter (INDEPENDENT_AMBULATORY_CARE_PROVIDER_SITE_OTHER): Payer: Self-pay | Admitting: Family Medicine

## 2019-06-26 ENCOUNTER — Other Ambulatory Visit: Payer: Self-pay

## 2019-06-26 ENCOUNTER — Ambulatory Visit (INDEPENDENT_AMBULATORY_CARE_PROVIDER_SITE_OTHER): Payer: BC Managed Care – PPO | Admitting: Family Medicine

## 2019-06-26 VITALS — BP 115/64 | HR 71 | Temp 98.7°F | Ht 68.0 in | Wt 207.0 lb

## 2019-06-26 DIAGNOSIS — F418 Other specified anxiety disorders: Secondary | ICD-10-CM

## 2019-06-26 DIAGNOSIS — E8881 Metabolic syndrome: Secondary | ICD-10-CM

## 2019-06-26 DIAGNOSIS — Z9189 Other specified personal risk factors, not elsewhere classified: Secondary | ICD-10-CM | POA: Diagnosis not present

## 2019-06-26 DIAGNOSIS — Z6831 Body mass index (BMI) 31.0-31.9, adult: Secondary | ICD-10-CM

## 2019-06-26 DIAGNOSIS — E669 Obesity, unspecified: Secondary | ICD-10-CM | POA: Diagnosis not present

## 2019-06-26 MED ORDER — FLUOXETINE HCL 20 MG PO CAPS: 20.0000 mg | ORAL_CAPSULE | Freq: Every day | ORAL | 0 refills | Status: DC

## 2019-06-27 NOTE — Progress Notes (Signed)
Chief Complaint:   OBESITY Crystal Camacho is here to discuss her progress with her obesity treatment plan along with follow-up of her obesity related diagnoses. Crystal Camacho is on the Category 3 Plan and states she is following her eating plan approximately 50% of the time. Crystal Camacho states she is using the elliptical for 30 minutes 4-6 times per week, walking 1 mile 1 time per week, and doing exercises for 5 minutes 5 times per week.  Today's visit was #: 4 Starting weight: 213 lbs Starting date: 05/02/2019 Today's weight: 207 lbs Today's date: 06/26/2019 Total lbs lost to date: 6 lbs Total lbs lost since last in-office visit: 0  Interim History: Aarthi says she has been feeling very overwhelmed and more depressed recently.  This manifests as getting more short-tempered and tired.  She finds herself doing some indulgent eating later at night.  She has some questions about the logistics of the food plan.  Subjective:   1. Insulin resistance Crystal Camacho has a diagnosis of insulin resistance based on her elevated fasting insulin level >5. She continues to work on diet and exercise to decrease her risk of diabetes.  She is not on metformin.  Lab Results  Component Value Date   INSULIN 12.4 05/02/2019   Lab Results  Component Value Date   HGBA1C 5.3 05/02/2019   2. Depression with emotional eating No SI/HI.  Crystal Camacho has been off Prozac for 1 month due to difficulty getting an appointment.  3. At risk for diabetes mellitus Crystal Camacho is at higher than average risk for developing diabetes due to her obesity.   Assessment/Plan:   1. Insulin resistance Crystal Camacho will continue to work on weight loss, exercise, and decreasing simple carbohydrates to help decrease the risk of diabetes. Crystal Camacho agreed to follow-up with [place] as directed to closely monitor her progress.  Repeat labs at the end of June.  2. Depression with emotional eating Will refill Prozac today.  Behavior modification  techniques were discussed today to help Crystal Camacho deal with her emotional/non-hunger eating behaviors.  Orders and follow up as documented in patient record.  - FLUoxetine (PROZAC) 20 MG capsule; Take 1 capsule (20 mg total) by mouth daily.  Dispense: 90 capsule; Refill: 0  3. At risk for diabetes mellitus Crystal Camacho was given approximately 15 minutes of diabetes education and counseling today. We discussed intensive lifestyle modifications today with an emphasis on weight loss as well as increasing exercise and decreasing simple carbohydrates in her diet. We also reviewed medication options with an emphasis on risk versus benefit of those discussed.   Repetitive spaced learning was employed today to elicit superior memory formation and behavioral change.  4. Class 1 obesity with serious comorbidity and body mass index (BMI) of 31.0 to 31.9 in adult, unspecified obesity type Crystal Camacho is currently in the action stage of change. As such, her goal is to continue with weight loss efforts. She has agreed to the Category 3 Plan.   Exercise goals: As is.  Behavioral modification strategies: increasing lean protein intake, meal planning and cooking strategies, emotional eating strategies and planning for success.  Crystal Camacho has agreed to follow-up with our clinic in 2 weeks. She was informed of the importance of frequent follow-up visits to maximize her success with intensive lifestyle modifications for her multiple health conditions.   Objective:   Blood pressure 115/64, pulse 71, temperature 98.7 F (37.1 C), temperature source Oral, height 5\' 8"  (1.727 m), weight 207 lb (93.9 kg), SpO2 98 %. Body mass index  is 31.47 kg/m.  General: Cooperative, alert, well developed, in no acute distress. HEENT: Conjunctivae and lids unremarkable. Cardiovascular: Regular rhythm.  Lungs: Normal work of breathing. Neurologic: No focal deficits.   Lab Results  Component Value Date   CREATININE 0.76 05/02/2019    BUN 10 05/02/2019   NA 139 05/02/2019   K 4.5 05/02/2019   CL 104 05/02/2019   CO2 22 05/02/2019   Lab Results  Component Value Date   ALT 18 05/02/2019   AST 21 05/02/2019   ALKPHOS 60 05/02/2019   BILITOT 0.2 05/02/2019   Lab Results  Component Value Date   HGBA1C 5.3 05/02/2019   HGBA1C 5.4 04/28/2018   Lab Results  Component Value Date   INSULIN 12.4 05/02/2019   Lab Results  Component Value Date   TSH 2.410 05/02/2019   Lab Results  Component Value Date   CHOL 187 05/02/2019   HDL 76 05/02/2019   LDLCALC 96 05/02/2019   TRIG 83 05/02/2019   CHOLHDL 2.5 04/28/2018   Lab Results  Component Value Date   WBC 5.0 05/02/2019   HGB 13.2 05/02/2019   HCT 39.1 05/02/2019   MCV 93 05/02/2019   PLT 240 05/02/2019   Attestation Statements:   Reviewed by clinician on day of visit: allergies, medications, problem list, medical history, surgical history, family history, social history, and previous encounter notes.  I, Insurance claims handler, CMA, am acting as transcriptionist for Reuben Likes, MD.  I have reviewed the above documentation for accuracy and completeness, and I agree with the above. - Katherina Mires, MD

## 2019-07-04 ENCOUNTER — Encounter: Payer: BC Managed Care – PPO | Admitting: Physician Assistant

## 2019-07-12 ENCOUNTER — Encounter (INDEPENDENT_AMBULATORY_CARE_PROVIDER_SITE_OTHER): Payer: Self-pay | Admitting: Family Medicine

## 2019-07-12 ENCOUNTER — Ambulatory Visit (INDEPENDENT_AMBULATORY_CARE_PROVIDER_SITE_OTHER): Payer: BC Managed Care – PPO | Admitting: Family Medicine

## 2019-07-12 ENCOUNTER — Other Ambulatory Visit: Payer: Self-pay

## 2019-07-12 VITALS — BP 115/76 | HR 80 | Temp 98.0°F | Ht 68.0 in | Wt 205.0 lb

## 2019-07-12 DIAGNOSIS — Z6831 Body mass index (BMI) 31.0-31.9, adult: Secondary | ICD-10-CM

## 2019-07-12 DIAGNOSIS — Z9189 Other specified personal risk factors, not elsewhere classified: Secondary | ICD-10-CM

## 2019-07-12 DIAGNOSIS — E559 Vitamin D deficiency, unspecified: Secondary | ICD-10-CM

## 2019-07-12 DIAGNOSIS — F418 Other specified anxiety disorders: Secondary | ICD-10-CM | POA: Diagnosis not present

## 2019-07-12 DIAGNOSIS — E669 Obesity, unspecified: Secondary | ICD-10-CM

## 2019-07-12 MED ORDER — BUPROPION HCL ER (SR) 150 MG PO TB12: ORAL_TABLET | ORAL | 0 refills | Status: DC

## 2019-07-17 NOTE — Progress Notes (Signed)
Chief Complaint:   Crystal Camacho is here to discuss her progress with her obesity treatment plan along with follow-up of her obesity related diagnoses. Crystal Camacho is on the Category 3 Plan and states she is following her eating plan approximately 80% of the time. Crystal Camacho states she is exercising on the elliptical 30 minutes 4-5 times per week.  Today's visit was #: 5 Starting weight: 213 lbs Starting date: 05/02/2019 Today's weight: 205 lbs Today's date: 07/12/2019 Total lbs lost to date: 8 Total lbs lost since last in-office visit: 2  Interim History: Crystal Camacho reports the last few weeks have been better - she feels more motivated. She does mention when a certain time of night comes she has more cravings and that the last 20% is eating indulgently (ice cream) or tortilla chips. She is doing well on the elliptical. She states it is easier to follow the plan while at work.  Subjective:   Depression with emotional eating. Crystal Camacho is struggling with emotional eating and using food for comfort to the extent that it is negatively impacting her health. She has been working on behavior modification techniques to help reduce her emotional eating and has been somewhat successful. She shows no sign of suicidal or homicidal ideations. Crystal Camacho reports symptoms are better controlled on Wellbutrin BID and fluoxetine.  Vitamin D deficiency. No nausea, vomiting, or muscle weakness. Crystal Camacho endorses fatigue. She is on Vitamin D 2,000 IU daily. Last Vitamin D 46.7 on 05/02/2019.  At risk for osteoporosis. Crystal Camacho is at higher risk of osteopenia and osteoporosis due to Vitamin D deficiency.   Assessment/Plan:   Depression with emotional eating. Behavior modification techniques were discussed today to help Joely deal with her emotional/non-hunger eating behaviors.  Orders and follow up as documented in patient record. Refill was given for buPROPion (WELLBUTRIN SR) 150 MG 12 hr  tablet PO BID #60 with 0 refills.  Vitamin D deficiency. Low Vitamin D level contributes to fatigue and are associated with obesity, breast, and colon cancer. She agrees to continue to take OTC Vitamin D and will follow-up for routine testing of Vitamin D, at least 2-3 times per year to avoid over-replacement.  At risk for osteoporosis. Crystal Camacho was given approximately 15 minutes of osteoporosis prevention counseling today. Crystal Camacho is at risk for osteopenia and osteoporosis due to her Vitamin D deficiency. She was encouraged to take her Vitamin D and follow her higher calcium diet and increase strengthening exercise to help strengthen her bones and decrease her risk of osteopenia and osteoporosis.  Repetitive spaced learning was employed today to elicit superior memory formation and behavioral change.  Class 1 obesity with serious comorbidity and body mass index (BMI) of 31.0 to 31.9 in adult, unspecified obesity type.  Crystal Camacho is currently in the action stage of change. As such, her goal is to continue with weight loss efforts. She has agreed to the Category 3 Plan.   Exercise goals: Crystal Camacho will continue her current exercise regimen.  Behavioral modification strategies: increasing lean protein intake, meal planning and cooking strategies and keeping healthy foods in the home.  Crystal Camacho has agreed to follow-up with our clinic in 2 weeks. She was informed of the importance of frequent follow-up visits to maximize her success with intensive lifestyle modifications for her multiple health conditions.   Objective:   Blood pressure 115/76, pulse 80, temperature 98 F (36.7 C), temperature source Oral, height 5\' 8"  (1.727 m), weight 205 lb (93 kg), last menstrual period 07/10/2019, SpO2  96 %. Body mass index is 31.17 kg/m.  General: Cooperative, alert, well developed, in no acute distress. HEENT: Conjunctivae and lids unremarkable. Cardiovascular: Regular rhythm.  Lungs: Normal work of  breathing. Neurologic: No focal deficits.   Lab Results  Component Value Date   CREATININE 0.76 05/02/2019   BUN 10 05/02/2019   NA 139 05/02/2019   K 4.5 05/02/2019   CL 104 05/02/2019   CO2 22 05/02/2019   Lab Results  Component Value Date   ALT 18 05/02/2019   AST 21 05/02/2019   ALKPHOS 60 05/02/2019   BILITOT 0.2 05/02/2019   Lab Results  Component Value Date   HGBA1C 5.3 05/02/2019   HGBA1C 5.4 04/28/2018   Lab Results  Component Value Date   INSULIN 12.4 05/02/2019   Lab Results  Component Value Date   TSH 2.410 05/02/2019   Lab Results  Component Value Date   CHOL 187 05/02/2019   HDL 76 05/02/2019   LDLCALC 96 05/02/2019   TRIG 83 05/02/2019   CHOLHDL 2.5 04/28/2018   Lab Results  Component Value Date   WBC 5.0 05/02/2019   HGB 13.2 05/02/2019   HCT 39.1 05/02/2019   MCV 93 05/02/2019   PLT 240 05/02/2019   No results found for: IRON, TIBC, FERRITIN  Attestation Statements:   Reviewed by clinician on day of visit: allergies, medications, problem list, medical history, surgical history, family history, social history, and previous encounter notes.  I, Crystal Camacho, am acting as transcriptionist for Crystal Likes, MD   I have reviewed the above documentation for accuracy and completeness, and I agree with the above. - Crystal Mires, MD

## 2019-08-06 ENCOUNTER — Ambulatory Visit (INDEPENDENT_AMBULATORY_CARE_PROVIDER_SITE_OTHER): Payer: BC Managed Care – PPO | Admitting: Adult Health

## 2019-08-06 ENCOUNTER — Encounter (INDEPENDENT_AMBULATORY_CARE_PROVIDER_SITE_OTHER): Payer: Self-pay | Admitting: Adult Health

## 2019-08-06 ENCOUNTER — Other Ambulatory Visit: Payer: Self-pay

## 2019-08-06 VITALS — BP 111/70 | HR 61 | Temp 98.0°F | Ht 68.0 in | Wt 209.0 lb

## 2019-08-06 DIAGNOSIS — E038 Other specified hypothyroidism: Secondary | ICD-10-CM

## 2019-08-06 DIAGNOSIS — Z6831 Body mass index (BMI) 31.0-31.9, adult: Secondary | ICD-10-CM

## 2019-08-06 DIAGNOSIS — F418 Other specified anxiety disorders: Secondary | ICD-10-CM

## 2019-08-06 DIAGNOSIS — E669 Obesity, unspecified: Secondary | ICD-10-CM | POA: Diagnosis not present

## 2019-08-06 DIAGNOSIS — Z9189 Other specified personal risk factors, not elsewhere classified: Secondary | ICD-10-CM | POA: Diagnosis not present

## 2019-08-06 MED ORDER — BUPROPION HCL ER (SR) 150 MG PO TB12: ORAL_TABLET | ORAL | 0 refills | Status: DC

## 2019-08-07 DIAGNOSIS — F418 Other specified anxiety disorders: Secondary | ICD-10-CM | POA: Insufficient documentation

## 2019-08-07 DIAGNOSIS — Z9189 Other specified personal risk factors, not elsewhere classified: Secondary | ICD-10-CM | POA: Insufficient documentation

## 2019-08-07 DIAGNOSIS — E669 Obesity, unspecified: Secondary | ICD-10-CM | POA: Insufficient documentation

## 2019-08-07 NOTE — Progress Notes (Signed)
Chief Complaint:   OBESITY Crystal Camacho is here to discuss her progress with her obesity treatment plan along with follow-up of her obesity related diagnoses. Crystal Camacho is on the Category 3 Plan and states she is following her eating plan approximately 0% of the time. Crystal Camacho states she is walking 20,000 steps 5 times per week.  Today's visit was #: 6 Starting weight: 213 lbs Starting date: 05/02/2019 Today's weight: 209 lbs Today's date: 08/06/2019 Total lbs lost to date: 4 Total lbs lost since last in-office visit: 0  Interim History: Crystal Camacho just returned from a [place]-day [place] trip with her family and was challenged to remain on plan while in [place]. She tried to "keep up on protein" while traveling and remaining well hydrated with water. Now that she is home she is ready to get back on plan.  She continues to enjoy regular exercise- home elliptical machine.  Subjective:   Other specified hypothyroidism. Thyroid panel in March was stable. Crystal Camacho is currently on levothyroxine 50 mcg daily. She denies an increase in fatigue.  Lab Results  Component Value Date   TSH 2.410 05/02/2019   Depression with anxiety. Crystal Camacho is struggling with emotional eating and using food for comfort to the extent that it is negatively impacting her health. She has been working on behavior modification techniques to help reduce her emotional eating and has been somewhat successful. She shows no sign of suicidal or homicidal ideations. Crystal Camacho reports improved mood since the addition of bupropion to her SSRI. She denies increased cravings.  At risk for diabetes mellitus. Crystal Camacho is at higher than average risk for developing diabetes due to insulin resistance and obesity.   Assessment/Plan:   Other specified hypothyroidism. Patient with long-standing hypothyroidism, on levothyroxine therapy. She appears euthyroid. Orders and follow up as documented in patient record. Crystal Camacho will  continue her current levothyroxine dosage.  Counseling . Good thyroid control is important for overall health. Supratherapeutic thyroid levels are dangerous and will not improve weight loss results. . The correct way to take levothyroxine is fasting, with water, separated by at least 30 minutes from breakfast, and separated by more than 4 hours from calcium, iron, multivitamins, acid reflux medications (PPIs).   Depression with anxiety. Behavior modification techniques were discussed today to help Crystal Camacho deal with her emotional/non-hunger eating behaviors.  Orders and follow up as documented in patient record. Refill was given for bupropion SR 150 mg BID, #60 with 0 refills. Crystal Camacho will continue fluoxetine 20 mg daily - no need for a refill today.  At risk for diabetes mellitus. Crystal Camacho was given approximately 15 minutes of diabetes education and counseling today. We discussed intensive lifestyle modifications today with an emphasis on weight loss as well as increasing exercise and decreasing simple carbohydrates in her diet. We also reviewed medication options with an emphasis on risk versus benefit of those discussed.   Repetitive spaced learning was employed today to elicit superior memory formation and behavioral change.  Class 1 obesity with serious comorbidity and body mass index (BMI) of 31.0 to 31.9 in adult, unspecified obesity type.  Crystal Camacho is currently in the action stage of change. As such, her goal is to continue with weight loss efforts. She has agreed to the Category 3 Plan.   Exercise goals: [name] will continue her current exercise regimen.  Behavioral modification strategies: increasing lean protein intake, decreasing simple carbohydrates, increasing water intake, meal planning and cooking strategies, travel eating strategies and celebration eating strategies.  Crystal Camacho  has agreed to follow-up with our clinic in 2 weeks. She was informed of the importance of  frequent follow-up visits to maximize her success with intensive lifestyle modifications for her multiple health conditions.   Objective:   Blood pressure 111/70, pulse 61, temperature 98 F (36.7 C), temperature source Oral, height 5\' 8"  (1.727 m), weight 209 lb (94.8 kg), last menstrual period 07/10/2019, SpO2 98 %. Body mass index is 31.78 kg/m.  General: Cooperative, alert, well developed, in no acute distress. HEENT: Conjunctivae and lids unremarkable. Cardiovascular: Regular rhythm.  Lungs: Normal work of breathing. Neurologic: No focal deficits.   Lab Results  Component Value Date   CREATININE 0.76 05/02/2019   BUN 10 05/02/2019   NA 139 05/02/2019   K 4.5 05/02/2019   CL 104 05/02/2019   CO2 22 05/02/2019   Lab Results  Component Value Date   ALT 18 05/02/2019   AST 21 05/02/2019   ALKPHOS 60 05/02/2019   BILITOT 0.2 05/02/2019   Lab Results  Component Value Date   HGBA1C 5.3 05/02/2019   HGBA1C 5.4 04/28/2018   Lab Results  Component Value Date   INSULIN 12.4 05/02/2019   Lab Results  Component Value Date   TSH 2.410 05/02/2019   Lab Results  Component Value Date   CHOL 187 05/02/2019   HDL 76 05/02/2019   LDLCALC 96 05/02/2019   TRIG 83 05/02/2019   CHOLHDL 2.5 04/28/2018   Lab Results  Component Value Date   WBC 5.0 05/02/2019   HGB 13.2 05/02/2019   HCT 39.1 05/02/2019   MCV 93 05/02/2019   PLT 240 05/02/2019   No results found for: IRON, TIBC, FERRITIN  Attestation Statements:   Reviewed by clinician on day of visit: allergies, medications, problem list, medical history, surgical history, family history, social history, and previous encounter notes.  I, Michaelene Song, am acting as Location manager for [employer], NP-C   I have reviewed the above documentation for accuracy and completeness, and I agree with the above. -  Esaw Grandchild, NP

## 2019-08-16 ENCOUNTER — Encounter (INDEPENDENT_AMBULATORY_CARE_PROVIDER_SITE_OTHER): Payer: Self-pay

## 2019-08-21 ENCOUNTER — Other Ambulatory Visit: Payer: Self-pay

## 2019-08-21 ENCOUNTER — Ambulatory Visit (INDEPENDENT_AMBULATORY_CARE_PROVIDER_SITE_OTHER): Payer: BC Managed Care – PPO | Admitting: Adult Health

## 2019-08-21 ENCOUNTER — Encounter (INDEPENDENT_AMBULATORY_CARE_PROVIDER_SITE_OTHER): Payer: Self-pay | Admitting: Adult Health

## 2019-08-21 VITALS — BP 116/82 | HR 71 | Temp 98.4°F | Ht 68.0 in | Wt 208.0 lb

## 2019-08-21 DIAGNOSIS — Z6831 Body mass index (BMI) 31.0-31.9, adult: Secondary | ICD-10-CM

## 2019-08-21 DIAGNOSIS — F3289 Other specified depressive episodes: Secondary | ICD-10-CM | POA: Diagnosis not present

## 2019-08-21 DIAGNOSIS — Z9189 Other specified personal risk factors, not elsewhere classified: Secondary | ICD-10-CM

## 2019-08-21 DIAGNOSIS — E669 Obesity, unspecified: Secondary | ICD-10-CM

## 2019-08-21 DIAGNOSIS — E038 Other specified hypothyroidism: Secondary | ICD-10-CM | POA: Diagnosis not present

## 2019-08-21 MED ORDER — LEVOTHYROXINE SODIUM 50 MCG PO TABS: ORAL_TABLET | ORAL | 0 refills | Status: AC

## 2019-08-21 NOTE — Progress Notes (Signed)
Chief Complaint:   OBESITY Crystal Camacho is here to discuss her progress with her obesity treatment plan along with follow-up of her obesity related diagnoses. Crystal Camacho is on the Category 3 Plan and states she is following her eating plan approximately 40% of the time. Crystal Camacho states she is not exercising secondary to having an injured back.  Today's visit was #: 7 Starting weight: 213 lbs Starting date: 05/02/2019 Today's weight: 208 lbs Today's date: 08/21/2019 Total lbs lost to date: 5 Total lbs lost since last in-office visit: 1  Interim History: Crystal Camacho feels that breakfast and lunch are fairly easy to follow, and then dinner is more of a challenge to stay on plan. When traveling she has tried to focus on lean protein and vegetables while limiting carbohydrates/sugar/ETOH. She recovering from recent recent back strain.  She was seen at an UC, provided with prescription for muscle relaxers. Back pain has prevented her from regular exercise.   Subjective:   Other specified hypothyroidism. Thyroid panel from 05/02/2019 was within normal limits. Crystal Camacho notes occasional afternoon "low energy."  Lab Results  Component Value Date   TSH 2.410 05/02/2019   Other depression, with emotional eating. Crystal Camacho is struggling with emotional eating and using food for comfort to the extent that it is negatively impacting her health. She has been working on behavior modification techniques to help reduce her emotional eating and has been somewhat successful. She shows no sign of suicidal or homicidal ideations. Mood is stable on bupropion SR 150 mg BID and fluoxetine 20 mg daily. Crystal Camacho denies increased excessive cravings.  At risk for activity intolerance. Crystal Camacho is at risk of exercise intolerance due to recent acute back pain.  Assessment/Plan:   Other specified hypothyroidism. Patient with long-standing hypothyroidism, on levothyroxine therapy. She appears euthyroid. Orders and follow up  as documented in patient record. Refill was given for levothyroxine (SYNTHROID) 50 MCG tablet #90 with 0 refills.  Counseling . Good thyroid control is important for overall health. Supratherapeutic thyroid levels are dangerous and will not improve weight loss results.  . The correct way to take levothyroxine is fasting, with water, separated by at least 30 minutes from breakfast, and separated by more than 4 hours from calcium, iron, multivitamins, acid reflux medications (PPIs).    Other depression, with emotional eating. Behavior modification techniques were discussed today to help Crystal Camacho deal with her emotional/non-hunger eating behaviors.  Orders and follow up as documented in patient record. Crystal Camacho will continue her current medication regimen.  At risk for activity intolerance. Crystal Camacho was given approximately 15 minutes of exercise intolerance counseling today. She is 44 y.o. female and has risk factors exercise intolerance including obesity. We discussed intensive lifestyle modifications today with an emphasis on specific weight loss instructions and strategies. Crystal Camacho will slowly increase activity as tolerated.  Repetitive spaced learning was employed today to elicit superior memory formation and behavioral change.  Class 1 obesity with serious comorbidity and body mass index (BMI) of 31.0 to 31.9 in adult, unspecified obesity type.  Crystal Camacho is currently in the action stage of change. As such, her goal is to continue with weight loss efforts. She has agreed to the Category 3 Plan and will journal 550-700 calories and 45+ grams of protein at supper.   Handouts were provided on Recipe Guide and Eating Out Guide.  Exercise goals: No exercise has been prescribed at this time.  Behavioral modification strategies: increasing lean protein intake, decreasing simple carbohydrates, decreasing liquid calories, meal planning  and cooking strategies, planning for success and keeping a  strict food journal.  Crystal Camacho has agreed to follow-up with our clinic in 2 weeks. She was informed of the importance of frequent follow-up visits to maximize her success with intensive lifestyle modifications for her multiple health conditions.   Objective:   Blood pressure 116/82, pulse 71, temperature 98.4 F (36.9 C), temperature source Oral, height 5\' 8"  (1.727 m), weight 208 lb (94.3 kg), SpO2 99 %. Body mass index is 31.63 kg/m.  General: Cooperative, alert, well developed, in no acute distress. HEENT: Conjunctivae and lids unremarkable. Cardiovascular: Regular rhythm.  Lungs: Normal work of breathing. Neurologic: No focal deficits.   Lab Results  Component Value Date   CREATININE 0.76 05/02/2019   BUN 10 05/02/2019   NA 139 05/02/2019   K 4.5 05/02/2019   CL 104 05/02/2019   CO2 22 05/02/2019   Lab Results  Component Value Date   ALT 18 05/02/2019   AST 21 05/02/2019   ALKPHOS 60 05/02/2019   BILITOT 0.2 05/02/2019   Lab Results  Component Value Date   HGBA1C 5.3 05/02/2019   HGBA1C 5.4 04/28/2018   Lab Results  Component Value Date   INSULIN 12.4 05/02/2019   Lab Results  Component Value Date   TSH 2.410 05/02/2019   Lab Results  Component Value Date   CHOL 187 05/02/2019   HDL 76 05/02/2019   LDLCALC 96 05/02/2019   TRIG 83 05/02/2019   CHOLHDL 2.5 04/28/2018   Lab Results  Component Value Date   WBC 5.0 05/02/2019   HGB 13.2 05/02/2019   HCT 39.1 05/02/2019   MCV 93 05/02/2019   PLT 240 05/02/2019   No results found for: IRON, TIBC, FERRITIN  Attestation Statements:   Reviewed by clinician on day of visit: allergies, medications, problem list, medical history, surgical history, family history, social history, and previous encounter notes.  I, 05/04/2019, am acting as Crystal Camacho for Energy manager, NP-C   I have reviewed the above documentation for accuracy and completeness, and I agree with the above. -  [employer], NP

## 2019-09-05 ENCOUNTER — Encounter (INDEPENDENT_AMBULATORY_CARE_PROVIDER_SITE_OTHER): Payer: Self-pay | Admitting: Adult Health

## 2019-09-05 ENCOUNTER — Other Ambulatory Visit: Payer: Self-pay

## 2019-09-05 ENCOUNTER — Ambulatory Visit (INDEPENDENT_AMBULATORY_CARE_PROVIDER_SITE_OTHER): Payer: BC Managed Care – PPO | Admitting: Adult Health

## 2019-09-05 VITALS — BP 104/71 | HR 64 | Temp 98.1°F | Ht 68.0 in | Wt 209.0 lb

## 2019-09-05 DIAGNOSIS — Z9189 Other specified personal risk factors, not elsewhere classified: Secondary | ICD-10-CM

## 2019-09-05 DIAGNOSIS — E8881 Metabolic syndrome: Secondary | ICD-10-CM | POA: Diagnosis not present

## 2019-09-05 DIAGNOSIS — F3289 Other specified depressive episodes: Secondary | ICD-10-CM | POA: Diagnosis not present

## 2019-09-05 DIAGNOSIS — Z6831 Body mass index (BMI) 31.0-31.9, adult: Secondary | ICD-10-CM

## 2019-09-05 DIAGNOSIS — E669 Obesity, unspecified: Secondary | ICD-10-CM | POA: Diagnosis not present

## 2019-09-05 MED ORDER — BUPROPION HCL ER (SR) 150 MG PO TB12: ORAL_TABLET | ORAL | 0 refills | Status: AC

## 2019-09-05 MED ORDER — FLUOXETINE HCL 20 MG PO CAPS: 20.0000 mg | ORAL_CAPSULE | Freq: Every day | ORAL | 0 refills | Status: AC

## 2019-09-05 NOTE — Progress Notes (Signed)
Chief Complaint:   OBESITY Crystal Camacho is here to discuss her progress with her obesity treatment plan along with follow-up of her obesity related diagnoses. Crystal Camacho is on the Category 3 Plan and states she is following her eating plan approximately 70% of the time. Crystal Camacho states she is doing cardio 30 minutes 2-3 times per week.  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 She would like to take a "pause" from the program for 4 weeks and focus on nutrition and increasing exercising over the next month with guidance from a friend who is a Engineer, production.  Subjective:   Insulin resistance. Crystal Camacho has a diagnosis of insulin resistance based on her elevated fasting insulin level >5. She continues to work on diet and exercise to decrease her risk of diabetes. Crystal Camacho is on metformin and denies polyphagia.  Lab Results  Component Value Date   INSULIN 12.4 05/02/2019   Lab Results  Component Value Date   HGBA1C 5.3 05/02/2019   Other depression, with emotional eating. Crystal Camacho is struggling with emotional eating and using food for comfort to the extent that it is negatively impacting her health. She has been working on behavior modification techniques to help reduce her emotional eating and has been somewhat successful. She shows no sign of suicidal or homicidal ideations. Crystal Camacho is on fluoxetine 20 mg daily and bupropion SR 150 mg BID. Blood pressure is excellent and she is not on anti-hypertensive prescription medication.  At risk for diabetes mellitus. Crystal Camacho is at higher than average risk for developing diabetes due to insulin resistance and obesity.   Assessment/Plan:    Insulin resistance. Crystal Camacho will continue to work on weight loss, exercise, and decreasing simple carbohydrates to help decrease the risk of diabetes. Crystal Camacho agreed to follow-up with [place] as directed to closely monitor her progress. She will increase her protein intake and limit simple carbohydrates. Comprehensive metabolic panel, Hemoglobin A1c, Insulin, random labs will be checked today.   Other depression, with emotional eating. Behavior modification techniques were discussed today to help Crystal Camacho deal with her emotional/non-hunger eating behaviors.  Orders and follow up as documented in patient record. Refills were given for FLUoxetine (PROZAC) 20 MG capsule daily #90 with 0 refills and buPROPion (WELLBUTRIN SR) 150 MG 12 hr tablet BID #60 with 0 refills.  At risk for diabetes mellitus. Crystal Camacho was given approximately 15 minutes of diabetes education and counseling today. We discussed intensive lifestyle modifications today with an emphasis on weight loss as well as increasing exercise and decreasing simple carbohydrates in her diet. We also reviewed medication options with an emphasis on risk versus benefit of those discussed.   Repetitive spaced learning was employed today to elicit superior memory formation and behavioral change.  Class 1 obesity with serious comorbidity and body mass index (BMI) of 31.0 to 31.9 in adult, unspecified obesity type.  Crystal Camacho is currently in the action stage of change. As such, her goal is to continue with weight loss efforts. She has agreed to the Category 3 Plan.   Exercise goals: Crystal Camacho will continue her current exercise regimen.   Behavioral modification strategies: increasing lean protein intake, decreasing simple carbohydrates, increasing water intake, no skipping meals, meal planning  and cooking strategies and planning for success.  Crystal Camacho has agreed to follow-up with our clinic in 4 weeks. She was informed of the importance of frequent  follow-up visits to maximize her success with intensive lifestyle modifications for her multiple health conditions.   Crystal Camacho was informed we would discuss her lab results at her next visit unless there is a critical issue that needs to be addressed sooner. Crystal Camacho agreed to keep her next visit at the agreed upon time to discuss these results.  Objective:   Blood pressure 104/71, pulse 64, temperature 98.1 F (36.7 C), temperature source Oral, height 5\' 8"  (1.727 m), weight 209 lb (94.8 kg), SpO2 98 %. Body mass index is 31.78 kg/m.  General: Cooperative, alert, well developed, in no acute distress. HEENT: Conjunctivae and lids unremarkable. Cardiovascular: Regular rhythm.  Lungs: Normal work of breathing. Neurologic: No focal deficits.   Lab Results  Component Value Date   CREATININE 0.76 05/02/2019   BUN 10 05/02/2019   NA 139 05/02/2019   K 4.5 05/02/2019   CL 104 05/02/2019   CO2 22 05/02/2019   Lab Results  Component Value Date   ALT 18 05/02/2019   AST 21 05/02/2019   ALKPHOS 60 05/02/2019   BILITOT 0.2 05/02/2019   Lab Results  Component Value Date   HGBA1C 5.3 05/02/2019   HGBA1C 5.4 04/28/2018   Lab Results  Component Value Date   INSULIN 12.4 05/02/2019   Lab Results  Component Value Date   TSH 2.410 05/02/2019   Lab Results  Component Value Date   CHOL 187 05/02/2019   HDL 76 05/02/2019   LDLCALC 96 05/02/2019   TRIG 83 05/02/2019   CHOLHDL 2.5 04/28/2018   Lab Results  Component Value Date   WBC 5.0 05/02/2019   HGB 13.2 05/02/2019   HCT 39.1 05/02/2019   MCV 93 05/02/2019   PLT 240 05/02/2019   No results found for: IRON, TIBC, FERRITIN  Attestation Statements:   Reviewed by clinician on day of visit: allergies, medications, problem list, medical history, surgical history, family history, social history, and previous encounter notes.  I, 05/04/2019, am acting as [employer]na [employer] for Energy manager, NP-C   I have reviewed the above  documentation for accuracy and completeness, and I agree with the above. -  [employer], NP

## 2019-09-06 LAB — COMPREHENSIVE METABOLIC PANEL
ALT: 13 IU/L (ref 0–32)
AST: 17 IU/L (ref 0–40)
Albumin/Globulin Ratio: 3.1 — ABNORMAL HIGH (ref 1.2–2.2)
Albumin: 4.9 g/dL — ABNORMAL HIGH (ref 3.8–4.8)
Alkaline Phosphatase: 58 IU/L (ref 48–121)
BUN/Creatinine Ratio: 25 — ABNORMAL HIGH (ref 9–23)
BUN: 15 mg/dL (ref 6–24)
Bilirubin Total: 0.4 mg/dL (ref 0.0–1.2)
CO2: 24 mmol/L (ref 20–29)
Calcium: 10.1 mg/dL (ref 8.7–10.2)
Chloride: 103 mmol/L (ref 96–106)
Creatinine, Ser: 0.6 mg/dL (ref 0.57–1.00)
GFR calc Af Amer: 129 mL/min/{1.73_m2} (ref >59)
GFR calc non Af Amer: 112 mL/min/{1.73_m2} (ref >59)
Globulin, Total: 1.6 g/dL (ref 1.5–4.5)
Glucose: 79 mg/dL (ref 65–99)
Potassium: 4.2 mmol/L (ref 3.5–5.2)
Sodium: 140 mmol/L (ref 134–144)
Total Protein: 6.5 g/dL (ref 6.0–8.5)

## 2019-09-06 LAB — HEMOGLOBIN A1C
Est. average glucose Bld gHb Est-mCnc: 105 mg/dL
Hgb A1c MFr Bld: 5.3 % (ref 4.8–5.6)

## 2019-09-06 LAB — INSULIN, RANDOM: INSULIN: 5.7 u[IU]/mL (ref 2.6–24.9)

## 2019-10-01 ENCOUNTER — Ambulatory Visit (INDEPENDENT_AMBULATORY_CARE_PROVIDER_SITE_OTHER): Payer: BC Managed Care – PPO | Admitting: Adult Health

## 2019-10-03 ENCOUNTER — Ambulatory Visit (INDEPENDENT_AMBULATORY_CARE_PROVIDER_SITE_OTHER): Payer: BC Managed Care – PPO | Admitting: Adult Health

## 2020-04-11 ENCOUNTER — Other Ambulatory Visit: Payer: Self-pay | Admitting: Physician Assistant

## 2020-04-11 DIAGNOSIS — Z1231 Encounter for screening mammogram for malignant neoplasm of breast: Secondary | ICD-10-CM

## 2020-06-02 ENCOUNTER — Ambulatory Visit: Payer: BC Managed Care – PPO

## 2020-06-10 ENCOUNTER — Ambulatory Visit
Admission: RE | Admit: 2020-06-10 | Discharge: 2020-06-10 | Disposition: A | Discharge status: Home / Self Care | Payer: BC Managed Care – PPO | Source: Ambulatory Visit | Attending: Physician Assistant | Admitting: Physician Assistant

## 2020-06-10 ENCOUNTER — Other Ambulatory Visit: Payer: Self-pay

## 2020-06-10 DIAGNOSIS — Z1231 Encounter for screening mammogram for malignant neoplasm of breast: Secondary | ICD-10-CM

## 2021-09-23 ENCOUNTER — Encounter (INDEPENDENT_AMBULATORY_CARE_PROVIDER_SITE_OTHER): Payer: Self-pay

## 2022-01-27 ENCOUNTER — Other Ambulatory Visit (HOSPITAL_COMMUNITY): Payer: Self-pay

## 2022-01-27 MED ORDER — WEGOVY 0.25 MG/0.5ML ~~LOC~~ SOAJ
0.2500 mg | SUBCUTANEOUS | 0 refills | Status: AC
  Filled 2022-01-27 – 2022-03-01 (×2): qty 2, 28d supply, fill #0

## 2022-01-27 MED ORDER — WEGOVY 0.5 MG/0.5ML ~~LOC~~ SOAJ
0.5000 mg | SUBCUTANEOUS | 0 refills | Status: AC
  Filled 2022-01-27: qty 2, 28d supply, fill #0

## 2022-01-27 MED ORDER — WEGOVY 1 MG/0.5ML ~~LOC~~ SOAJ
1.0000 mg | SUBCUTANEOUS | 0 refills | Status: AC
  Filled 2022-01-27: qty 2, 28d supply, fill #0

## 2022-02-12 ENCOUNTER — Other Ambulatory Visit (HOSPITAL_COMMUNITY): Payer: Self-pay

## 2022-02-17 ENCOUNTER — Other Ambulatory Visit (HOSPITAL_COMMUNITY): Payer: Self-pay

## 2022-02-26 ENCOUNTER — Other Ambulatory Visit (HOSPITAL_BASED_OUTPATIENT_CLINIC_OR_DEPARTMENT_OTHER): Payer: Self-pay

## 2022-03-01 ENCOUNTER — Other Ambulatory Visit (HOSPITAL_COMMUNITY): Payer: Self-pay

## 2022-03-03 ENCOUNTER — Other Ambulatory Visit: Payer: Self-pay

## 2022-03-04 ENCOUNTER — Other Ambulatory Visit (HOSPITAL_COMMUNITY): Payer: Self-pay

## 2022-03-08 ENCOUNTER — Other Ambulatory Visit: Payer: Self-pay | Admitting: Physician Assistant

## 2022-03-08 DIAGNOSIS — Z1231 Encounter for screening mammogram for malignant neoplasm of breast: Secondary | ICD-10-CM

## 2022-03-12 ENCOUNTER — Other Ambulatory Visit (HOSPITAL_COMMUNITY): Payer: Self-pay

## 2022-04-26 ENCOUNTER — Ambulatory Visit
Admission: RE | Admit: 2022-04-26 | Discharge: 2022-04-26 | Disposition: A | Discharge status: Home / Self Care | Payer: BC Managed Care – PPO | Source: Ambulatory Visit | Attending: Physician Assistant | Admitting: Physician Assistant

## 2022-04-26 DIAGNOSIS — Z1231 Encounter for screening mammogram for malignant neoplasm of breast: Secondary | ICD-10-CM

## 2022-04-29 ENCOUNTER — Other Ambulatory Visit: Payer: Self-pay | Admitting: Physician Assistant

## 2022-04-29 DIAGNOSIS — R928 Other abnormal and inconclusive findings on diagnostic imaging of breast: Secondary | ICD-10-CM

## 2022-05-18 ENCOUNTER — Ambulatory Visit: Payer: BC Managed Care – PPO

## 2022-05-18 ENCOUNTER — Ambulatory Visit
Admission: RE | Admit: 2022-05-18 | Discharge: 2022-05-18 | Disposition: A | Discharge status: Home / Self Care | Payer: BC Managed Care – PPO | Source: Ambulatory Visit | Attending: Physician Assistant | Admitting: Physician Assistant

## 2022-05-18 DIAGNOSIS — R928 Other abnormal and inconclusive findings on diagnostic imaging of breast: Secondary | ICD-10-CM

## 2022-07-17 IMAGING — MG MM DIGITAL SCREENING BILAT W/ TOMO AND CAD
6 of 10 series · 6 of 30 positions shown · non-contrast
Comparison: Previous exam(s).

CLINICAL DATA: Screening.

EXAM:
DIGITAL SCREENING BILATERAL MAMMOGRAM WITH TOMOSYNTHESIS AND CAD
TECHNIQUE: Bilateral screening digital craniocaudal and mediolateral oblique
mammograms were obtained. Bilateral screening digital breast
tomosynthesis was performed. The images were evaluated with
computer-aided detection.

[L CC synth-2D]
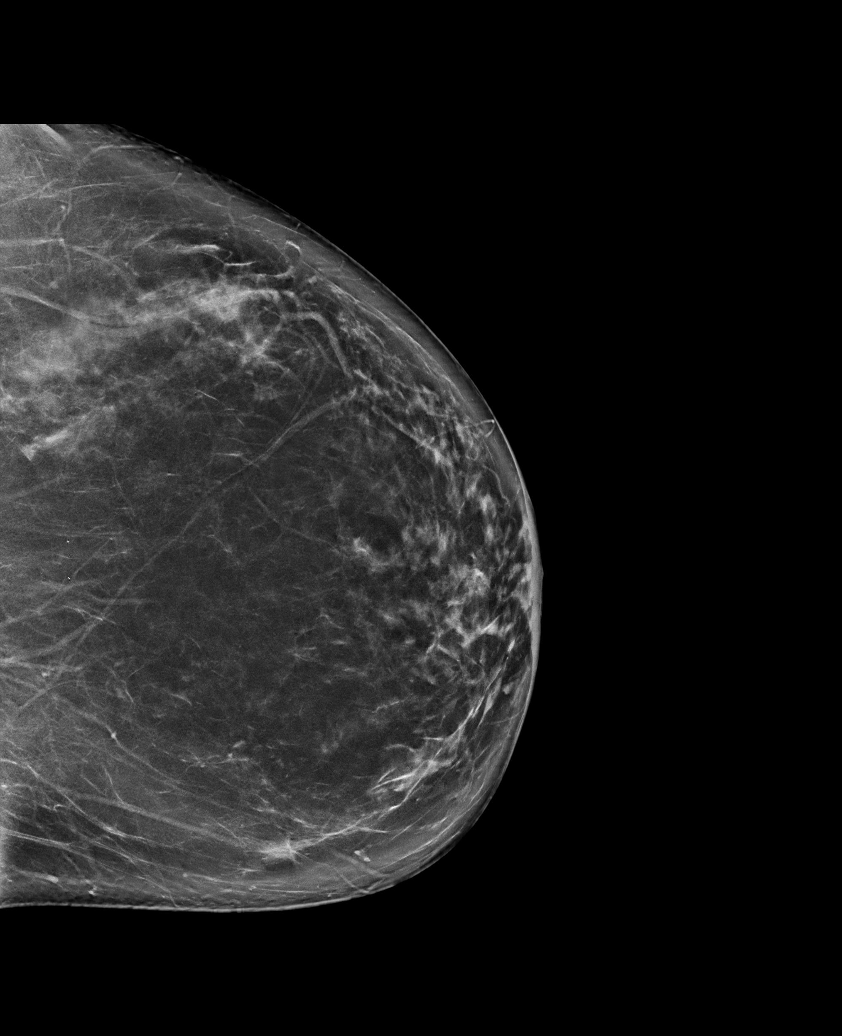

[L MLO synth-2D]
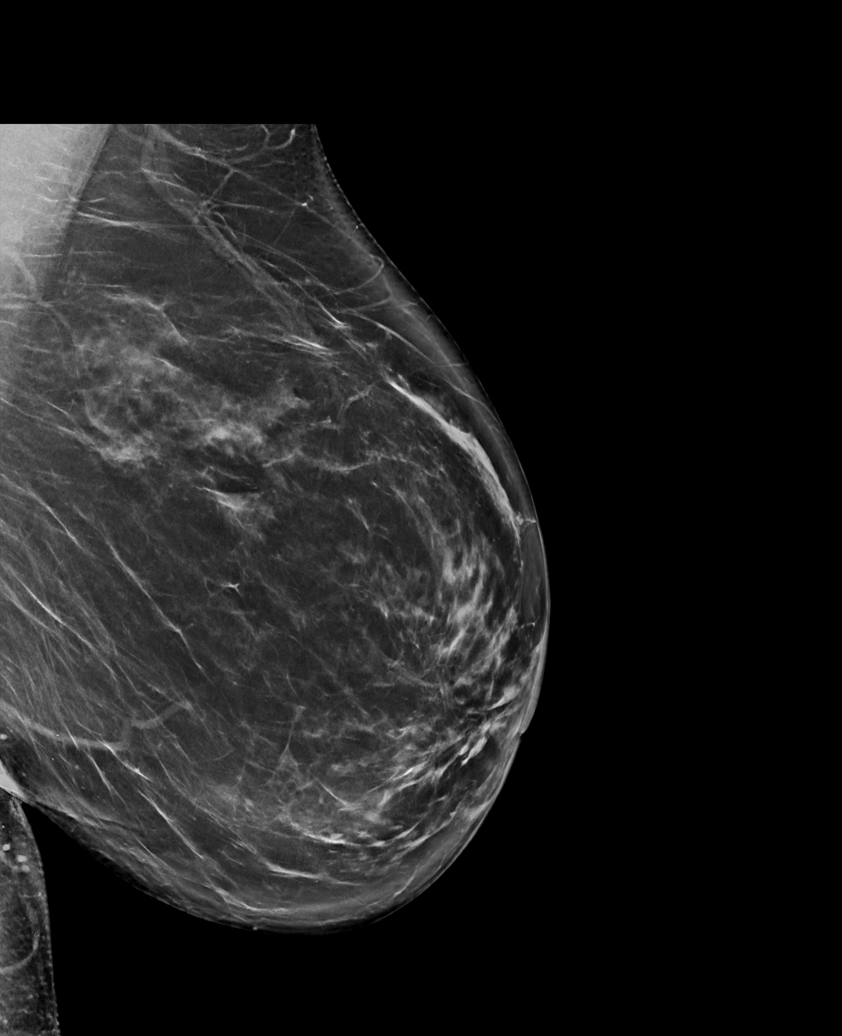

[R MLO synth-2D (1 of 2)]
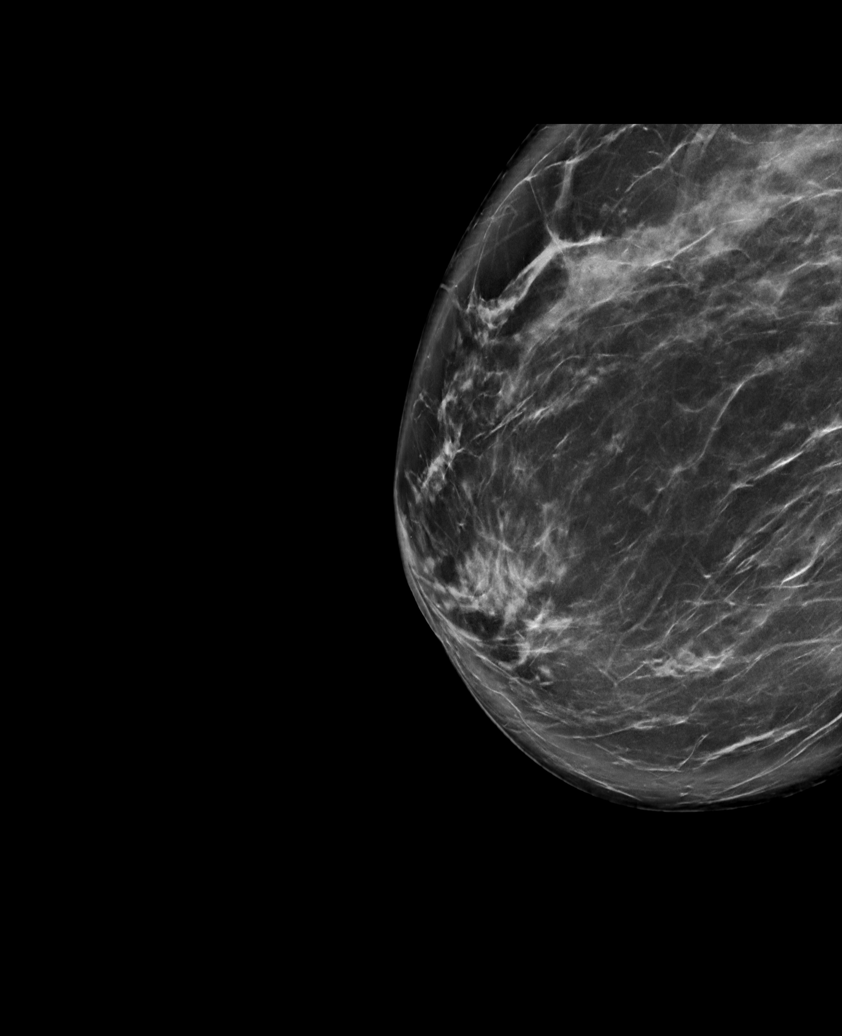

[R CC synth-2D]
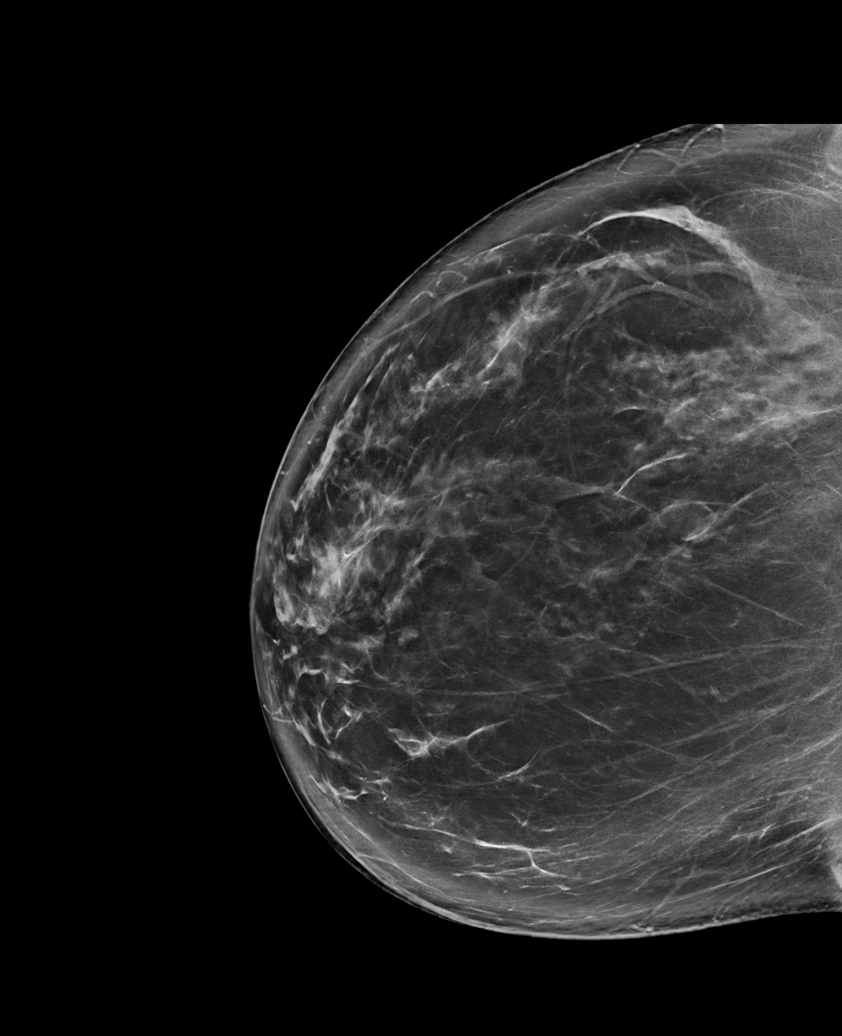

[R MLO synth-2D (2 of 2)]
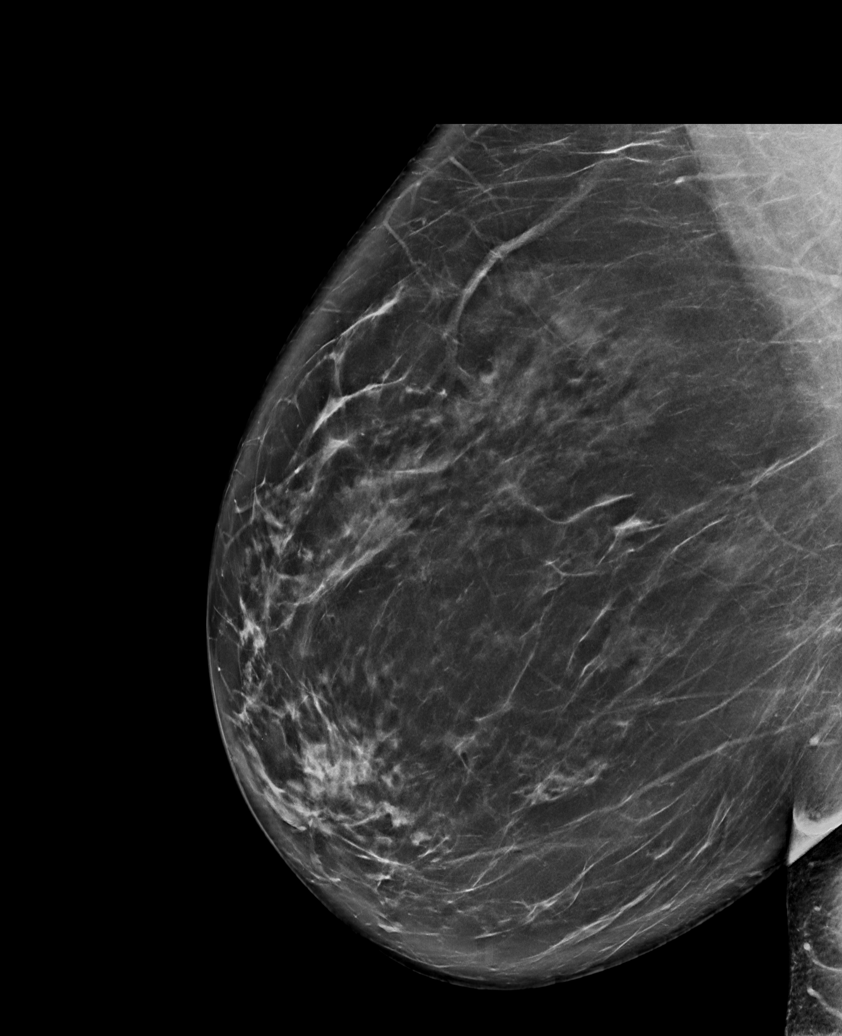

[L CC tomo · tomo slice 44/87.0]
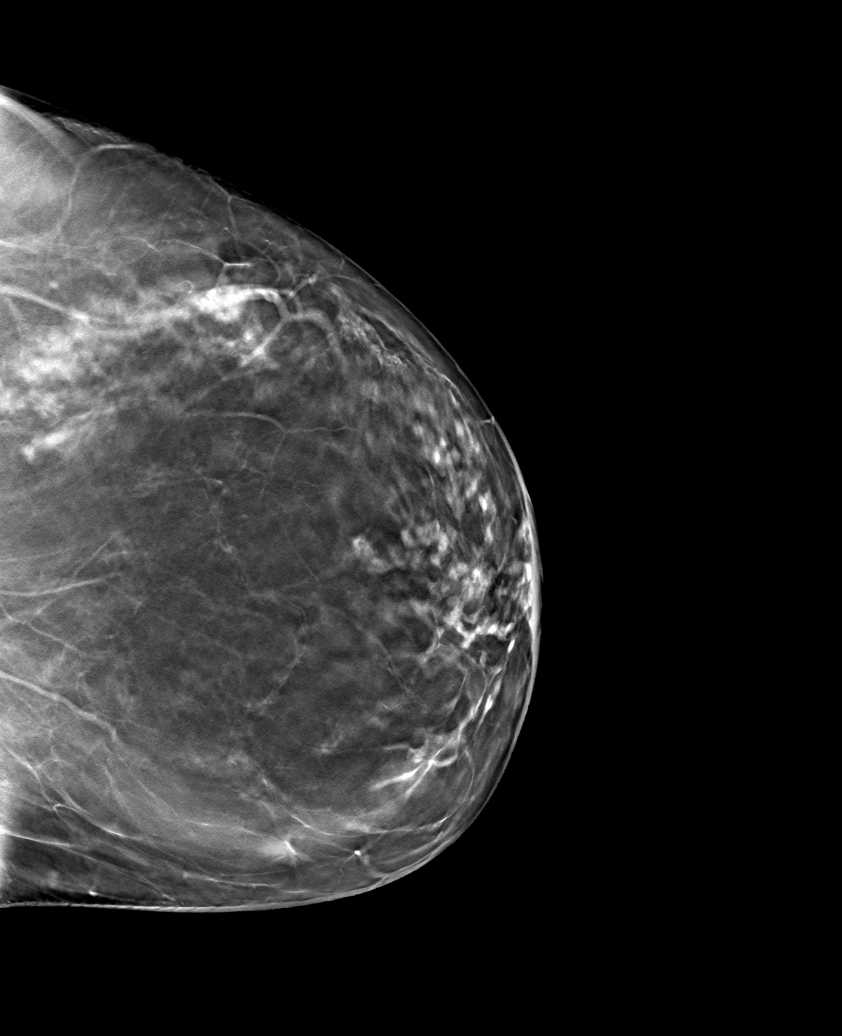

[6 of 30 positions shown; findings below may reference images not displayed]

ACR Breast Density Category b: There are scattered areas of
fibroglandular density.
FINDINGS: There are no findings suspicious for malignancy. The images were
evaluated with computer-aided detection.
IMPRESSION: No mammographic evidence of malignancy. A result letter of this
screening mammogram will be mailed directly to the patient.

RECOMMENDATION:
Screening mammogram in one year. (Code:[id number]-[id number])

BI-RADS CATEGORY  1: Negative.

## 2023-04-28 ENCOUNTER — Other Ambulatory Visit: Payer: Self-pay

## 2023-09-12 ENCOUNTER — Other Ambulatory Visit (HOSPITAL_BASED_OUTPATIENT_CLINIC_OR_DEPARTMENT_OTHER): Payer: Self-pay

## 2023-09-12 MED ORDER — PHENTERMINE HCL 37.5 MG PO TABS
37.5000 mg | ORAL_TABLET | Freq: Every morning | ORAL | 1 refills | Status: AC
  Filled 2023-09-12: qty 30, 30d supply, fill #0

## 2023-09-19 ENCOUNTER — Other Ambulatory Visit (HOSPITAL_BASED_OUTPATIENT_CLINIC_OR_DEPARTMENT_OTHER): Payer: Self-pay

## 2024-02-15 ENCOUNTER — Other Ambulatory Visit (HOSPITAL_BASED_OUTPATIENT_CLINIC_OR_DEPARTMENT_OTHER): Payer: Self-pay

## 2024-02-15 MED ORDER — PHENTERMINE HCL 37.5 MG PO TABS
37.5000 mg | ORAL_TABLET | Freq: Every day | ORAL | 1 refills | Status: AC
  Filled 2024-02-15: qty 30, 30d supply, fill #0
